# Patient Record
Sex: Male | Born: 1965 | Race: White | Hispanic: No | Smoking: Current every day smoker
Health system: Southern US, Community
[De-identification: ages and names within clinical notes are randomized; demographics above are authoritative.]

## PROBLEM LIST (undated history)

## (undated) DIAGNOSIS — F32A Depression, unspecified: Secondary | ICD-10-CM

## (undated) DIAGNOSIS — T7840XA Allergy, unspecified, initial encounter: Secondary | ICD-10-CM

## (undated) DIAGNOSIS — F419 Anxiety disorder, unspecified: Secondary | ICD-10-CM

## (undated) DIAGNOSIS — J449 Chronic obstructive pulmonary disease, unspecified: Secondary | ICD-10-CM

## (undated) DIAGNOSIS — Z720 Tobacco use: Secondary | ICD-10-CM

## (undated) DIAGNOSIS — I639 Cerebral infarction, unspecified: Secondary | ICD-10-CM

## (undated) DIAGNOSIS — I10 Essential (primary) hypertension: Secondary | ICD-10-CM

## (undated) DIAGNOSIS — E785 Hyperlipidemia, unspecified: Secondary | ICD-10-CM

## (undated) DIAGNOSIS — F1010 Alcohol abuse, uncomplicated: Secondary | ICD-10-CM

## (undated) HISTORY — DX: Chronic obstructive pulmonary disease, unspecified: J44.9

## (undated) HISTORY — DX: Anxiety disorder, unspecified: F41.9

## (undated) HISTORY — PX: HAND TENDON SURGERY: SHX663

## (undated) HISTORY — PX: SKIN GRAFT: SHX250

## (undated) HISTORY — PX: NO PAST SURGERIES: SHX2092

## (undated) HISTORY — DX: Cerebral infarction, unspecified: I63.9

## (undated) HISTORY — DX: Hyperlipidemia, unspecified: E78.5

## (undated) HISTORY — DX: Allergy, unspecified, initial encounter: T78.40XA

## (undated) HISTORY — DX: Depression, unspecified: F32.A

---

## 2006-01-15 ENCOUNTER — Emergency Department: Payer: Self-pay | Admitting: Emergency Medicine

## 2006-01-30 ENCOUNTER — Ambulatory Visit: Payer: Self-pay | Admitting: Unknown Physician Specialty

## 2006-02-02 ENCOUNTER — Encounter: Payer: Self-pay | Admitting: Unknown Physician Specialty

## 2006-02-26 ENCOUNTER — Encounter: Payer: Self-pay | Admitting: Unknown Physician Specialty

## 2006-03-28 ENCOUNTER — Encounter: Payer: Self-pay | Admitting: Unknown Physician Specialty

## 2008-07-27 ENCOUNTER — Emergency Department: Payer: Self-pay | Admitting: Emergency Medicine

## 2008-07-27 ENCOUNTER — Other Ambulatory Visit: Payer: Self-pay

## 2012-05-16 ENCOUNTER — Ambulatory Visit: Payer: Self-pay | Admitting: Internal Medicine

## 2013-02-21 ENCOUNTER — Ambulatory Visit: Payer: Self-pay

## 2017-01-04 ENCOUNTER — Encounter: Payer: Self-pay | Admitting: Medical Oncology

## 2017-01-04 ENCOUNTER — Emergency Department: Payer: Commercial Managed Care - PPO

## 2017-01-04 ENCOUNTER — Emergency Department
Admission: EM | Admit: 2017-01-04 | Discharge: 2017-01-04 | Disposition: A | Discharge status: Home / Self Care | Payer: Commercial Managed Care - PPO | Attending: Emergency Medicine | Admitting: Emergency Medicine

## 2017-01-04 DIAGNOSIS — N281 Cyst of kidney, acquired: Secondary | ICD-10-CM | POA: Diagnosis not present

## 2017-01-04 DIAGNOSIS — R109 Unspecified abdominal pain: Secondary | ICD-10-CM | POA: Diagnosis present

## 2017-01-04 DIAGNOSIS — R0789 Other chest pain: Secondary | ICD-10-CM | POA: Diagnosis not present

## 2017-01-04 LAB — BASIC METABOLIC PANEL
Anion gap: 6 (ref 5–15)
BUN: 11 mg/dL (ref 6–20)
CHLORIDE: 107 mmol/L (ref 101–111)
CO2: 23 mmol/L (ref 22–32)
CREATININE: 0.66 mg/dL (ref 0.61–1.24)
Calcium: 8.7 mg/dL — ABNORMAL LOW (ref 8.9–10.3)
GFR calc non Af Amer: >60 mL/min (ref >60)
Glucose, Bld: 96 mg/dL (ref 65–99)
Potassium: 3.7 mmol/L (ref 3.5–5.1)
SODIUM: 136 mmol/L (ref 135–145)

## 2017-01-04 LAB — URINALYSIS, COMPLETE (UACMP) WITH MICROSCOPIC
BACTERIA UA: NONE SEEN
Bilirubin Urine: NEGATIVE
Glucose, UA: NEGATIVE mg/dL
Ketones, ur: NEGATIVE mg/dL
LEUKOCYTES UA: NEGATIVE
Nitrite: NEGATIVE
PH: 5 (ref 5.0–8.0)
Protein, ur: NEGATIVE mg/dL
SPECIFIC GRAVITY, URINE: 1.02 (ref 1.005–1.030)

## 2017-01-04 LAB — CBC
HEMATOCRIT: 47 % (ref 40.0–52.0)
HEMOGLOBIN: 16.3 g/dL (ref 13.0–18.0)
MCH: 37.3 pg — ABNORMAL HIGH (ref 26.0–34.0)
MCHC: 34.7 g/dL (ref 32.0–36.0)
MCV: 107.3 fL — ABNORMAL HIGH (ref 80.0–100.0)
Platelets: 177 10*3/uL (ref 150–440)
RBC: 4.37 MIL/uL — ABNORMAL LOW (ref 4.40–5.90)
RDW: 14.5 % (ref 11.5–14.5)
WBC: 7.6 10*3/uL (ref 3.8–10.6)

## 2017-01-04 MED ORDER — HYDROMORPHONE HCL 1 MG/ML IJ SOLN
1.0000 mg | Freq: Once | INTRAMUSCULAR | Status: AC
  Administered 2017-01-04: 1 mg via INTRAVENOUS
  Filled 2017-01-04: qty 1

## 2017-01-04 MED ORDER — OXYCODONE-ACETAMINOPHEN 5-325 MG PO TABS
ORAL_TABLET | ORAL | Status: AC
  Filled 2017-01-04: qty 1

## 2017-01-04 MED ORDER — OXYCODONE-ACETAMINOPHEN 5-325 MG PO TABS
1.0000 | ORAL_TABLET | ORAL | Status: DC | PRN
  Administered 2017-01-04: 1 via ORAL

## 2017-01-04 MED ORDER — IBUPROFEN 800 MG PO TABS: 800.0000 mg | ORAL_TABLET | Freq: Three times a day (TID) | ORAL | 0 refills | Status: DC | PRN

## 2017-01-04 MED ORDER — SODIUM CHLORIDE 0.9 % IV BOLUS (SEPSIS)
1000.0000 mL | Freq: Once | INTRAVENOUS | Status: AC
  Administered 2017-01-04: 1000 mL via INTRAVENOUS

## 2017-01-04 MED ORDER — DIAZEPAM 5 MG PO TABS: 5.0000 mg | ORAL_TABLET | Freq: Three times a day (TID) | ORAL | 0 refills | Status: DC | PRN

## 2017-01-04 MED ORDER — OXYCODONE HCL 5 MG PO TABS: 5.0000 mg | ORAL_TABLET | ORAL | 0 refills | Status: DC | PRN

## 2017-01-04 NOTE — ED Triage Notes (Signed)
Pt reports left flank pain that began 2 days ago and has continued to worsen. Denies dysuria. Denies NVD.

## 2017-01-04 NOTE — Discharge Instructions (Signed)
On your CT, both of your kidneys were found to have 1 cyst each. Please let your primary care physician know in order to make recommendations for any additional follow-up that is needed.  Your CT scan did not show any kidney stones or other obvious abnormalities.  Please treat your side pain with ice or heat for 10 minutes every 2-3 hours. You may take Tylenol or Motrin for mild to moderate pain, and oxycodone for severe pain. Valium as for severe muscle spasm. You may.drive or go to work within 8 hours of taking Percocet or Valium. Please make an appointment to follow up with your primary care physician in 3-5 days.  Return to the emergency department if you develop worsening pain, shortness of breath, lightheadedness or fainting, fever, or any other symptoms concerning to you.

## 2017-01-04 NOTE — ED Provider Notes (Signed)
[facility] Emergency Department Provider Note  ____________________________________________  Time seen: Approximately 2:29 PM  I have reviewed the triage vital signs and the nursing notes.   HISTORY  Chief Complaint Flank Pain    HPI Jeff Patton is a 51 y.o. male with ongoing tobacco abuse presenting with left side pain. The patient reports that for the past 3 days, he has had a constant worsening pain in the left lateral chest wall over the lowest ribs, and then radiating towards the back. He works in a [employer], lifting heavy objects, although does not note any particular strain or trauma event. He denies any associated shortness of breath but does have worse pain with breathing or positional changes, or palpation of the area. No overlying skin changes. No associated fever, chills, dysuria, urinary frequency or hematuria. No nausea or vomiting.   History reviewed. No pertinent past medical history.  There are no active problems to display for this patient.   No past surgical history on file.    Allergies Patient has no known allergies.  No family history on file.  Social History Social History  Substance Use Topics  . Smoking status: Not on file  . Smokeless tobacco: Not on file  . Alcohol use Not on file    Review of Systems Constitutional: No fever/chills.No lightheadedness or syncope. Eyes: No visual changes. ENT: No sore throat. No congestion or rhinorrhea. Cardiovascular: Denies chest pain. Denies palpitations. Respiratory: Denies shortness of breath.  No cough. Gastrointestinal: No abdominal pain.  No nausea, no vomiting.  No diarrhea.  No constipation. Positive left lateral lower chest wall pain and left back pain. Genitourinary: Negative for dysuria. No urinary frequency. No hematuria. Musculoskeletal: Left lateral chest wall pain radiating to the back. Skin: Negative for rash. No overlying skin changes. Neurological:  Negative for headaches. No focal numbness, tingling or weakness.   10-point ROS otherwise negative.  ____________________________________________   PHYSICAL EXAM:  VITAL SIGNS: ED Triage Vitals [01/04/17 1112]  Enc Vitals Group     BP (!) 161/110     Pulse Rate 86     Resp 16     Temp 98.2 F (36.8 C)     Temp Source Oral     SpO2 99 %     Weight 180 lb (81.6 kg)     Height 5\' 10"  (1.778 m)     Head Circumference      Peak Flow      Pain Score 10     Pain Loc      Pain Edu?      Excl. in GC?     Constitutional: Alert and oriented. Uncomfortable appearing, in obvious worsening pain with positional changes especially lying in a supine position. Answers questions appropriately. Eyes: Conjunctivae are normal.  EOMI. No scleral icterus. Head: Atraumatic. Nose: No congestion/rhinnorhea. Mouth/Throat: Mucous membranes are moist.  Neck: No stridor.  Supple.  No JVD. No meningismus. Cardiovascular: Normal rate, regular rhythm. No murmurs, rubs or gallops.  Respiratory: Normal respiratory effort.  No accessory muscle use or retractions. Lungs CTAB.  No wheezes, rales or ronchi. Gastrointestinal: Soft, nontender and nondistended.  No guarding or rebound.  No peritoneal signs. Mild left upper quadrant pain, left lateral chest wall pain over the most distal ribs, and minimal left CVA tenderness. No right CVA tenderness. No overlying skin changes or palpable crepitus, no palpable rib instability.  Musculoskeletal: No LE edema.  Neurologic:  A&Ox3.  Speech is clear.  Face and smile  are symmetric.  EOMI.  Moves all extremities well. Skin:  Skin is warm, dry and intact. No rash noted. Psychiatric: Mood and affect are normal. Speech and behavior are normal.  Normal judgement.  ____________________________________________   LABS (all labs ordered are listed, but only abnormal results are displayed)  Labs Reviewed  BASIC METABOLIC PANEL - Abnormal; Notable for the following:        Result Value   Calcium 8.7 (*)    All other components within normal limits  CBC - Abnormal; Notable for the following:    RBC 4.37 (*)    MCV 107.3 (*)    MCH 37.3 (*)    All other components within normal limits  URINALYSIS, COMPLETE (UACMP) WITH MICROSCOPIC   ____________________________________________  EKG  ED ECG REPORT I, Rockne Menghini, the attending physician, personally viewed and interpreted this ECG.   Date: 01/04/2017  EKG Time: 1444  Rate: 54  Rhythm: sinus bradycardia  Axis: normal  Intervals:first degree block  ST&T Change: Nonspecific T-wave inversions in V1. No ST elevation.  ____________________________________________  RADIOLOGY  Ct Renal Stone Study  Result Date: 01/04/2017 CLINICAL DATA:  Two day history of left flank pain EXAM: CT ABDOMEN AND PELVIS WITHOUT CONTRAST TECHNIQUE: Multidetector CT imaging of the abdomen and pelvis was performed following the standard protocol without oral or intravenous contrast material administration. COMPARISON:  None. FINDINGS: Lower chest: There is patchy bibasilar atelectatic change. There is a small hiatal hernia. Hepatobiliary: No focal liver lesions are apparent on this noncontrast enhanced study. Gallbladder wall is not appreciably thickened. There is no biliary duct dilatation. Pancreas: No pancreatic mass or inflammatory focus. Spleen: No splenic lesions are evident. Adrenals/Urinary Tract: Adrenals appear normal bilaterally. There is a 1.7 x 1.7 cm cyst arising from the periphery of the mid right kidney. There is an approximately 1 x 1 cm cyst in mid to lower region left kidney. There is no hydronephrosis on either side. There is no evident renal or ureteral calculus on either side. Urinary bladder is midline with wall thickness within normal limits. Stomach/Bowel: There are multiple sigmoid diverticula without diverticulitis. There is no bowel wall or mesenteric thickening. There is no evident bowel obstruction.  No free air or portal venous air. Vascular/Lymphatic: There is atherosclerotic calcification in the aorta and common iliac arteries. Foci of calcification are noted in each external iliac and internal iliac arteries. No aneurysm. Major mesenteric vessels appear patent on this noncontrast enhanced study. There is no demonstrable adenopathy in the abdomen or pelvis. Reproductive: There are scattered small calculi in the prostate. Prostate and seminal vesicles appear slightly prominent. No pelvic mass or pelvic fluid collection is evident. Other: Appendix appears normal. No ascites or abscess is evident in the abdomen or pelvis. There is a minimal ventral hernia containing only fat. There is mild fat in each inguinal ring. Musculoskeletal: There is lumbar levoscoliosis. There is degenerative change in lumbar spine. There are no blastic or lytic bone lesions. No intramuscular or abdominal wall lesions. IMPRESSION: No renal or ureteral calculi.  No hydronephrosis. Sigmoid diverticula without diverticulitis. No bowel wall thickening or bowel obstruction. No abscess. Appendix appears normal. Aortoiliac atherosclerosis. Small hiatal hernia.  Small ventral hernia containing only fat Scattered small prostatic calculi with prominence of prostate and seminal vesicles. Advise correlation with PSA. Electronically Signed   By: Bretta Bang III M.D.   On: 01/04/2017 15:21    ____________________________________________   PROCEDURES  Procedure(s) performed: None  Procedures  Critical Care performed: No  ____________________________________________   INITIAL IMPRESSION / ASSESSMENT AND PLAN / ED COURSE  Pertinent labs & imaging results that were available during my care of the patient were reviewed by me and considered in my medical decision making (see chart for details).  51 y.o. male with ongoing tobacco abuse presenting with 3 days of progressively worsening left-sided pain that is worse with positional  changes. Given the amount of discomfort the patient has with positional changes, deep breaths, and palpation, it is possible that he has a musculoskeletal strain or a nerve impingement. However, renal colic, subclinical rib fracture, are also possible. Obstruction is very unlikely. We'll get CT imaging of the abdomen and pelvis, which should capture the lower thorax as well. In the meantime, I'll treat the patient symptomatically and reevaluate him for his pain.  ----------------------------------------- 4:05 PM on 01/04/2017 -----------------------------------------  The patient's laboratory studies are reassuring.  He continues to be hemodynamically stable and his pain has significantly improved with symptomatic treatment. The CT scan shows bilateral renal cysts, which are not the cause of his pain, but I have told him to let his primary care physician now for further monitoring recommendations. There are no kidney stones, or any other acute causes for the patient's pain. I will discharge the patient home with instructions for cryotherapy, heat therapy, and symptomatic relief or muscular skeletal pain. The patient understands return precautions as well as follow-up instructions.   ____________________________________________  FINAL CLINICAL IMPRESSION(S) / ED DIAGNOSES  Final diagnoses:  Left flank pain  Bilateral renal cysts  Left-sided chest wall pain         NEW MEDICATIONS STARTED DURING THIS VISIT:  New Prescriptions   DIAZEPAM (VALIUM) 5 MG TABLET    Take 1 tablet (5 mg total) by mouth every 8 (eight) hours as needed for muscle spasms.   IBUPROFEN (ADVIL,MOTRIN) 800 MG TABLET    Take 1 tablet (800 mg total) by mouth every 8 (eight) hours as needed for mild pain or moderate pain (with food).   OXYCODONE (ROXICODONE) 5 MG IMMEDIATE RELEASE TABLET    Take 1-2 tablets (5-10 mg total) by mouth every 4 (four) hours as needed for severe pain.      Rockne Menghini, MD 01/04/17  [phone]

## 2017-05-21 ENCOUNTER — Emergency Department: Payer: Commercial Managed Care - PPO

## 2017-05-21 ENCOUNTER — Inpatient Hospital Stay
Admission: EM | Admit: 2017-05-21 | Discharge: 2017-05-26 | DRG: 871 | Disposition: A | Discharge status: Home / Self Care | Payer: Commercial Managed Care - PPO | Attending: Internal Medicine | Admitting: Internal Medicine

## 2017-05-21 ENCOUNTER — Encounter: Payer: Self-pay | Admitting: Emergency Medicine

## 2017-05-21 DIAGNOSIS — I10 Essential (primary) hypertension: Secondary | ICD-10-CM | POA: Diagnosis present

## 2017-05-21 DIAGNOSIS — J440 Chronic obstructive pulmonary disease with acute lower respiratory infection: Secondary | ICD-10-CM | POA: Diagnosis present

## 2017-05-21 DIAGNOSIS — J181 Lobar pneumonia, unspecified organism: Secondary | ICD-10-CM

## 2017-05-21 DIAGNOSIS — R042 Hemoptysis: Secondary | ICD-10-CM | POA: Diagnosis present

## 2017-05-21 DIAGNOSIS — R1011 Right upper quadrant pain: Secondary | ICD-10-CM

## 2017-05-21 DIAGNOSIS — F17210 Nicotine dependence, cigarettes, uncomplicated: Secondary | ICD-10-CM | POA: Diagnosis present

## 2017-05-21 DIAGNOSIS — I82412 Acute embolism and thrombosis of left femoral vein: Secondary | ICD-10-CM | POA: Diagnosis present

## 2017-05-21 DIAGNOSIS — I2699 Other pulmonary embolism without acute cor pulmonale: Secondary | ICD-10-CM | POA: Diagnosis present

## 2017-05-21 DIAGNOSIS — J189 Pneumonia, unspecified organism: Secondary | ICD-10-CM

## 2017-05-21 DIAGNOSIS — Z8249 Family history of ischemic heart disease and other diseases of the circulatory system: Secondary | ICD-10-CM

## 2017-05-21 DIAGNOSIS — R0602 Shortness of breath: Secondary | ICD-10-CM

## 2017-05-21 DIAGNOSIS — A419 Sepsis, unspecified organism: Principal | ICD-10-CM

## 2017-05-21 DIAGNOSIS — R0781 Pleurodynia: Secondary | ICD-10-CM

## 2017-05-21 DIAGNOSIS — R109 Unspecified abdominal pain: Secondary | ICD-10-CM

## 2017-05-21 HISTORY — DX: Essential (primary) hypertension: I10

## 2017-05-21 LAB — HEPATIC FUNCTION PANEL
ALBUMIN: 3.5 g/dL (ref 3.5–5.0)
ALT: 15 U/L — ABNORMAL LOW (ref 17–63)
AST: 31 U/L (ref 15–41)
Alkaline Phosphatase: 77 U/L (ref 38–126)
BILIRUBIN TOTAL: 1.9 mg/dL — AB (ref 0.3–1.2)
Bilirubin, Direct: 0.4 mg/dL (ref 0.1–0.5)
Indirect Bilirubin: 1.5 mg/dL — ABNORMAL HIGH (ref 0.3–0.9)
TOTAL PROTEIN: 7 g/dL (ref 6.5–8.1)

## 2017-05-21 LAB — URINALYSIS, COMPLETE (UACMP) WITH MICROSCOPIC
BILIRUBIN URINE: NEGATIVE
Bacteria, UA: NONE SEEN
Glucose, UA: NEGATIVE mg/dL
Ketones, ur: NEGATIVE mg/dL
Leukocytes, UA: NEGATIVE
NITRITE: NEGATIVE
Protein, ur: NEGATIVE mg/dL
SPECIFIC GRAVITY, URINE: 1.014 (ref 1.005–1.030)
Squamous Epithelial / LPF: NONE SEEN
pH: 7 (ref 5.0–8.0)

## 2017-05-21 LAB — BASIC METABOLIC PANEL
Anion gap: 9 (ref 5–15)
BUN: 8 mg/dL (ref 6–20)
CALCIUM: 8.8 mg/dL — AB (ref 8.9–10.3)
CO2: 24 mmol/L (ref 22–32)
CREATININE: 0.66 mg/dL (ref 0.61–1.24)
Chloride: 100 mmol/L — ABNORMAL LOW (ref 101–111)
GFR calc Af Amer: >60 mL/min (ref >60)
Glucose, Bld: 97 mg/dL (ref 65–99)
Potassium: 3.9 mmol/L (ref 3.5–5.1)
SODIUM: 133 mmol/L — AB (ref 135–145)

## 2017-05-21 LAB — LACTIC ACID, PLASMA: Lactic Acid, Venous: 1 mmol/L (ref 0.5–1.9)

## 2017-05-21 LAB — CBC
HCT: 47.7 % (ref 40.0–52.0)
Hemoglobin: 16.6 g/dL (ref 13.0–18.0)
MCH: 37 pg — ABNORMAL HIGH (ref 26.0–34.0)
MCHC: 34.8 g/dL (ref 32.0–36.0)
MCV: 106.5 fL — ABNORMAL HIGH (ref 80.0–100.0)
PLATELETS: 147 10*3/uL — AB (ref 150–440)
RBC: 4.48 MIL/uL (ref 4.40–5.90)
RDW: 14.1 % (ref 11.5–14.5)
WBC: 12.8 10*3/uL — AB (ref 3.8–10.6)

## 2017-05-21 LAB — TROPONIN I: Troponin I: <0.03 ng/mL (ref <0.03)

## 2017-05-21 MED ORDER — ACETAMINOPHEN 325 MG PO TABS
650.0000 mg | ORAL_TABLET | Freq: Four times a day (QID) | ORAL | Status: DC | PRN
  Filled 2017-05-21: qty 2

## 2017-05-21 MED ORDER — IPRATROPIUM-ALBUTEROL 0.5-2.5 (3) MG/3ML IN SOLN
3.0000 mL | RESPIRATORY_TRACT | Status: DC | PRN
  Filled 2017-05-21: qty 3

## 2017-05-21 MED ORDER — ENOXAPARIN SODIUM 40 MG/0.4ML ~~LOC~~ SOLN: 40.0000 mg | SUBCUTANEOUS | Status: DC

## 2017-05-21 MED ORDER — CYCLOBENZAPRINE HCL 10 MG PO TABS
5.0000 mg | ORAL_TABLET | Freq: Three times a day (TID) | ORAL | Status: DC | PRN
  Administered 2017-05-21 – 2017-05-25 (×5): 5 mg via ORAL
  Filled 2017-05-21 (×2): qty 1
  Filled 2017-05-21: qty 0.5
  Filled 2017-05-21 (×3): qty 1

## 2017-05-21 MED ORDER — SODIUM CHLORIDE 0.9 % IV BOLUS (SEPSIS)
500.0000 mL | Freq: Once | INTRAVENOUS | Status: AC
  Administered 2017-05-21: 500 mL via INTRAVENOUS

## 2017-05-21 MED ORDER — ONDANSETRON HCL 4 MG/2ML IJ SOLN
4.0000 mg | Freq: Four times a day (QID) | INTRAMUSCULAR | Status: DC | PRN
  Filled 2017-05-21: qty 2

## 2017-05-21 MED ORDER — CEFTRIAXONE SODIUM-DEXTROSE 1-3.74 GM-% IV SOLR
1.0000 g | INTRAVENOUS | Status: DC
  Filled 2017-05-21: qty 50

## 2017-05-21 MED ORDER — ONDANSETRON HCL 4 MG/2ML IJ SOLN
4.0000 mg | Freq: Once | INTRAMUSCULAR | Status: AC
  Administered 2017-05-21: 4 mg via INTRAVENOUS
  Filled 2017-05-21: qty 2

## 2017-05-21 MED ORDER — GUAIFENESIN-DM 100-10 MG/5ML PO SYRP
5.0000 mL | ORAL_SOLUTION | ORAL | Status: DC | PRN
  Administered 2017-05-24 (×2): 5 mL via ORAL
  Filled 2017-05-21 (×4): qty 5

## 2017-05-21 MED ORDER — IPRATROPIUM-ALBUTEROL 0.5-2.5 (3) MG/3ML IN SOLN
3.0000 mL | Freq: Once | RESPIRATORY_TRACT | Status: AC
  Administered 2017-05-21: 3 mL via RESPIRATORY_TRACT
  Filled 2017-05-21: qty 3

## 2017-05-21 MED ORDER — ENOXAPARIN SODIUM 40 MG/0.4ML ~~LOC~~ SOLN
40.0000 mg | SUBCUTANEOUS | Status: DC
  Administered 2017-05-21 – 2017-05-22 (×2): 40 mg via SUBCUTANEOUS
  Filled 2017-05-21 (×2): qty 0.4

## 2017-05-21 MED ORDER — DEXTROSE 5 % IV SOLN
1.0000 g | Freq: Once | INTRAVENOUS | Status: AC
  Administered 2017-05-21: 1 g via INTRAVENOUS
  Filled 2017-05-21: qty 10

## 2017-05-21 MED ORDER — MORPHINE SULFATE (PF) 4 MG/ML IV SOLN
4.0000 mg | INTRAVENOUS | Status: DC | PRN
  Administered 2017-05-23 – 2017-05-24 (×8): 4 mg via INTRAVENOUS
  Filled 2017-05-21 (×8): qty 1

## 2017-05-21 MED ORDER — OXYCODONE HCL 5 MG PO TABS
5.0000 mg | ORAL_TABLET | ORAL | Status: DC | PRN
  Administered 2017-05-21 – 2017-05-25 (×6): 5 mg via ORAL
  Filled 2017-05-21 (×7): qty 1

## 2017-05-21 MED ORDER — FENTANYL CITRATE (PF) 100 MCG/2ML IJ SOLN
100.0000 ug | INTRAMUSCULAR | Status: DC | PRN
  Administered 2017-05-21 (×2): 100 ug via INTRAVENOUS
  Filled 2017-05-21 (×2): qty 2

## 2017-05-21 MED ORDER — DEXTROSE 5 % IV SOLN
500.0000 mg | INTRAVENOUS | Status: DC
  Administered 2017-05-22: 500 mg via INTRAVENOUS
  Filled 2017-05-21: qty 500

## 2017-05-21 MED ORDER — ONDANSETRON HCL 4 MG PO TABS
4.0000 mg | ORAL_TABLET | Freq: Four times a day (QID) | ORAL | Status: DC | PRN
  Filled 2017-05-21: qty 1

## 2017-05-21 MED ORDER — MORPHINE SULFATE (PF) 4 MG/ML IV SOLN
4.0000 mg | INTRAVENOUS | Status: DC | PRN
  Administered 2017-05-21: 4 mg via INTRAVENOUS
  Filled 2017-05-21: qty 1

## 2017-05-21 MED ORDER — SODIUM CHLORIDE 0.9 % IV BOLUS (SEPSIS)
1000.0000 mL | Freq: Once | INTRAVENOUS | Status: AC
  Administered 2017-05-21: 1000 mL via INTRAVENOUS

## 2017-05-21 MED ORDER — NICOTINE 14 MG/24HR TD PT24
14.0000 mg | MEDICATED_PATCH | Freq: Every day | TRANSDERMAL | Status: DC
  Administered 2017-05-21 – 2017-05-26 (×7): 14 mg via TRANSDERMAL
  Filled 2017-05-21 (×6): qty 1

## 2017-05-21 MED ORDER — LORAZEPAM 2 MG/ML IJ SOLN
2.0000 mg | Freq: Once | INTRAMUSCULAR | Status: AC
Start: 2017-05-21 — End: 2017-05-21
  Administered 2017-05-21: 2 mg via INTRAVENOUS
  Filled 2017-05-21: qty 1

## 2017-05-21 MED ORDER — AZITHROMYCIN 500 MG IV SOLR
500.0000 mg | Freq: Once | INTRAVENOUS | Status: AC
  Administered 2017-05-21: 500 mg via INTRAVENOUS
  Filled 2017-05-21: qty 500

## 2017-05-21 MED ORDER — ACETAMINOPHEN 650 MG RE SUPP
650.0000 mg | Freq: Four times a day (QID) | RECTAL | Status: DC | PRN
  Filled 2017-05-21: qty 1

## 2017-05-21 MED ORDER — KETOROLAC TROMETHAMINE 30 MG/ML IJ SOLN
15.0000 mg | Freq: Once | INTRAMUSCULAR | Status: AC
  Administered 2017-05-23: 12:00:00 15 mg via INTRAVENOUS
  Filled 2017-05-21: qty 1

## 2017-05-21 NOTE — ED Triage Notes (Signed)
Pt reports right side chest wall pain that was radiating to the front earlier this morning. Denies any injury. Pt arrived via EMS from home.   Pt holding on the right side taking shallow breaths.  Pt tachypneic.  Pt states he has a chronic cough. Denies any injury. Pain radiates to the back as well.

## 2017-05-21 NOTE — ED Notes (Signed)
Pt returned from xray via stretcher.

## 2017-05-21 NOTE — Consult Note (Signed)
Pharmacy Antibiotic Note  Jeff Patton is a 51 y.o. male admitted on 05/21/2017 with pneumonia.  Pharmacy has been consulted for azithromycin and ceftriaxone dosing.  Plan: azithromycin 500mg  q 24hr, ceftriaxone 1g q 24 hr  Pharmacy will sign off  Height: 5\' 10"  (177.8 cm) Weight: 185 lb (83.9 kg) IBW/kg (Calculated) : 73  Temp (24hrs), Avg:100.5 F (38.1 C), Min:99 F (37.2 C), Max:101.9 F (38.8 C)   Recent Labs Lab 05/21/17 1138  WBC 12.8*  CREATININE 0.66    Estimated Creatinine Clearance: 115.3 mL/min (by C-G formula based on SCr of 0.66 mg/dL).    No Known Allergies  Antimicrobials this admission: ceftriaxone 6/24 >>  azithromycin 6/24 >>   Dose adjustments this admission:   Microbiology results: 6/24 BCx:    Thank you for allowing pharmacy to be a part of this patient's care.  Jeff Patton, Pharm.D, BCPS Clinical Pharmacist  05/21/2017 4:28 PM

## 2017-05-21 NOTE — ED Notes (Signed)
Ems pt vis wheelchair ,back/flank pain , cough , 7/10 pain , 20 gauge r hand , HR 100 , 99%RA, NKA , No meds

## 2017-05-21 NOTE — ED Notes (Signed)
Pt states he started with back pain last night on R side and now it has radiated to R rib cage. Pt denies N&V&D&fever. States he felt chills and sweats through out night. Alert, oriented, guarding R rib cage. States kidney infection at age 89, denies kidney stones. Medical hx HTN.

## 2017-05-21 NOTE — ED Notes (Signed)
Pt taken to CT via stretcher.

## 2017-05-21 NOTE — ED Provider Notes (Signed)
[facility] Emergency Department Provider Note    None    (approximate)  I have reviewed the triage vital signs and the nursing notes.   HISTORY  Chief Complaint Right flank pain   HPI Jeff Patton is a 51 y.o. male starts with right upper quadrant and right upper back pain that awoke the patient sleep early this morning. Since he started on some discomfort yesterday. Patient currently rates the pain as 10 out of 10 in severity. No fevers. States it is hard for him to catch his breath due to the pain. Denies any lower extremity swelling. Does smoke. No recent cough. No pain when he takes a deep breath. There is no midsternal pain or discomfort radiating to his jaw left shoulder. There is no diaphoresis.. Denies any dysuria or hematuria.   Past Medical History:  Diagnosis Date  . Hypertension    History reviewed. No pertinent family history. History reviewed. No pertinent surgical history. There are no active problems to display for this patient.     Prior to Admission medications   Medication Sig Start Date End Date Taking? Authorizing Provider  diazepam (VALIUM) 5 MG tablet Take 1 tablet (5 mg total) by mouth every 8 (eight) hours as needed for muscle spasms. 01/04/17 01/04/18  Rockne Menghini, MD  ibuprofen (ADVIL,MOTRIN) 800 MG tablet Take 1 tablet (800 mg total) by mouth every 8 (eight) hours as needed for mild pain or moderate pain (with food). 01/04/17   Rockne Menghini, MD  oxyCODONE (ROXICODONE) 5 MG immediate release tablet Take 1-2 tablets (5-10 mg total) by mouth every 4 (four) hours as needed for severe pain. 01/04/17   Rockne Menghini, MD    Allergies Patient has no known allergies.    Social History Social History  Substance Use Topics  . Smoking status: Current Every Day Smoker    Packs/day: 1.00    Types: Cigarettes  . Smokeless tobacco: Never Used  . Alcohol use No    Review of Systems Patient denies  headaches, rhinorrhea, blurry vision, numbness, shortness of breath, chest pain, edema, cough, abdominal pain, nausea, vomiting, diarrhea, dysuria, fevers, rashes or hallucinations unless otherwise stated above in HPI. ____________________________________________   PHYSICAL EXAM:  VITAL SIGNS: Vitals:   05/21/17 1109  BP: (!) 155/101  Pulse: 99  Resp: (!) 22  Temp: 99 F (37.2 C)    Constitutional: Alert and oriented. Uncomfortable appearing but in no acute distress. Eyes: Conjunctivae are normal.  Head: Atraumatic. Nose: No congestion/rhinnorhea. Mouth/Throat: Mucous membranes are moist.   Neck: No stridor. Painless ROM.  Cardiovascular: Normal rate, regular rhythm. Grossly normal heart sounds.  Good peripheral circulation. Respiratory: Normal respiratory effort.  No retractions. Lungs CTAB. Gastrointestinal: Soft, with RUQ ttp. No distention. No abdominal bruits. + right cva ttp Musculoskeletal: No lower extremity tenderness nor edema.  No joint effusions. Neurologic:  Normal speech and language. No gross focal neurologic deficits are appreciated. No facial droop Skin:  Skin is warm, dry and intact. No rash noted. Psychiatric: Mood and affect are normal. Speech and behavior are normal.  ____________________________________________   LABS (all labs ordered are listed, but only abnormal results are displayed)  Results for orders placed or performed during the hospital encounter of 05/21/17 (from the past 24 hour(s))  Basic metabolic panel     Status: Abnormal   Collection Time: 05/21/17 11:38 AM  Result Value Ref Range   Sodium 133 (L) 135 - 145 mmol/L   Potassium 3.9 3.5 -  5.1 mmol/L   Chloride 100 (L) 101 - 111 mmol/L   CO2 24 22 - 32 mmol/L   Glucose, Bld 97 65 - 99 mg/dL   BUN 8 6 - 20 mg/dL   Creatinine, Ser 1.61 0.61 - 1.24 mg/dL   Calcium 8.8 (L) 8.9 - 10.3 mg/dL   GFR calc non Af Amer >60 >60 mL/min   GFR calc Af Amer >60 >60 mL/min   Anion gap 9 5 - 15  CBC      Status: Abnormal   Collection Time: 05/21/17 11:38 AM  Result Value Ref Range   WBC 12.8 (H) 3.8 - 10.6 K/uL   RBC 4.48 4.40 - 5.90 MIL/uL   Hemoglobin 16.6 13.0 - 18.0 g/dL   HCT 09.6 04.5 - 40.9 %   MCV 106.5 (H) 80.0 - 100.0 fL   MCH 37.0 (H) 26.0 - 34.0 pg   MCHC 34.8 32.0 - 36.0 g/dL   RDW 81.1 91.4 - 78.2 %   Platelets 147 (L) 150 - 440 K/uL  Troponin I     Status: None   Collection Time: 05/21/17 11:38 AM  Result Value Ref Range   Troponin I <0.03 <0.03 ng/mL  Hepatic function panel     Status: Abnormal   Collection Time: 05/21/17 11:38 AM  Result Value Ref Range   Total Protein 7.0 6.5 - 8.1 g/dL   Albumin 3.5 3.5 - 5.0 g/dL   AST 31 15 - 41 U/L   ALT 15 (L) 17 - 63 U/L   Alkaline Phosphatase 77 38 - 126 U/L   Total Bilirubin 1.9 (H) 0.3 - 1.2 mg/dL   Bilirubin, Direct 0.4 0.1 - 0.5 mg/dL   Indirect Bilirubin 1.5 (H) 0.3 - 0.9 mg/dL   ____________________________________________  EKG My review and personal interpretation at Time: 11:17   Indication: chest pain  Rate: 95  Rhythm: sinus Axis: normal Other: normal intervals, no stemi ____________________________________________  RADIOLOGY  I personally reviewed all radiographic images ordered to evaluate for the above acute complaints and reviewed radiology reports and findings.  These findings were personally discussed with the patient.  Please see medical record for radiology report.  ____________________________________________   PROCEDURES  Procedure(s) performed:  Procedures    Critical Care performed: yes CRITICAL CARE Performed by: Willy Eddy   Total critical care time: 30 minutes  Critical care time was exclusive of separately billable procedures and treating other patients.  Critical care was necessary to treat or prevent imminent or life-threatening deterioration.  Critical care was time spent personally by me on the following activities: development of treatment plan with patient  and/or surrogate as well as nursing, discussions with consultants, evaluation of patient's response to treatment, examination of patient, obtaining history from patient or surrogate, ordering and performing treatments and interventions, ordering and review of laboratory studies, ordering and review of radiographic studies, pulse oximetry and re-evaluation of patient's condition.  ____________________________________________   INITIAL IMPRESSION / ASSESSMENT AND PLAN / ED COURSE  Pertinent labs & imaging results that were available during my care of the patient were reviewed by me and considered in my medical decision making (see chart for details).  DDX: ACS, pericarditis, esophagitis, boerhaaves, pe, dissection, pna, bronchitis, costochondritis   Jeff Patton is a 51 y.o. who presents to the ED with above complaints of chest pain as described above.  Presentation concerning for kidney stone based on his significant discomfort. He is to Light here breath sounds bilaterally and suspicious that this is  splinting from pain. Does have some abdominal tenderness. Will order CT imaging to evaluate for any evidence of stone.  The patient will be placed on continuous pulse oximetry and telemetry for monitoring.  Laboratory evaluation will be sent to evaluate for the above complaints.      Clinical Course as of May 21 1552  Sun May 21, 2017  1530 Patient now febrile. He is sent for right upper quadrant ultrasound to ensure this is not a referred pain from cholecystitis. Certain possible pneumonia.  [PR]  1552 Patient reassessed. Still persistent tachypnea. This provided for his symptoms are secondary to likely pleurisy in the setting of a community acquired pneumonia. He does meet septic criteria and therefore do feel patient will require admission to hospital for further evaluation and management.  [PR]    Clinical Course User Index [PR] Willy Eddyobinson, Jeshurun [place], MD      ____________________________________________   FINAL CLINICAL IMPRESSION(S) / ED DIAGNOSES  Final diagnoses:  RUQ pain  Community acquired pneumonia of right lower lobe of lung (HCC)  Sepsis, due to unspecified organism [facility](HCC)      NEW MEDICATIONS STARTED DURING THIS VISIT:  New Prescriptions   No medications on file     Note:  This document was prepared using Dragon voice recognition software and may include unintentional dictation errors.    Willy Eddyobinson, Willa Brocks, MD 05/21/17 [phone]

## 2017-05-21 NOTE — H&P (Signed)
[facility] - [place] at [facility]   PATIENT NAME: Jeff Patton    MR#:  [id number]  DATE OF BIRTH:  1966-02-22  DATE OF ADMISSION:  05/21/2017  PRIMARY CARE PHYSICIAN: Patient, No Pcp Per   REQUESTING/REFERRING PHYSICIAN: Roxan Hockeyobinson, MD  CHIEF COMPLAINT:   Chief Complaint  Patient presents with  . Chest Pain    HISTORY OF PRESENT ILLNESS:  Jeff Patton  is a 51 y.o. male who presents with Right-sided back and chest pain. He states his symptoms started around 2:00 this morning. Later on he began to develop some shortness of breath.  Pain is worse with deep breathing. Here in the ED he became febrile, was found to have a mildly elevated white blood cell count, and is mildly tachycardic. Imaging initially ordered for evaluation of possible renal stone showed right basilar pneumonia. Hospitalists were called for admission.  PAST MEDICAL HISTORY:   Past Medical History:  Diagnosis Date  . Hypertension     PAST SURGICAL HISTORY:   Past Surgical History:  Procedure Laterality Date  . NO PAST SURGERIES      SOCIAL HISTORY:   Social History  Substance Use Topics  . Smoking status: Current Every Day Smoker    Packs/day: 1.00    Types: Cigarettes  . Smokeless tobacco: Never Used  . Alcohol use No    FAMILY HISTORY:   Family History  Problem Relation Age of Onset  . Hypertension Other     DRUG ALLERGIES:  No Known Allergies  MEDICATIONS AT HOME:   Prior to Admission medications   Medication Sig Start Date End Date Taking? Authorizing Provider  diazepam (VALIUM) 5 MG tablet Take 1 tablet (5 mg total) by mouth every 8 (eight) hours as needed for muscle spasms. 01/04/17 01/04/18  Rockne MenghiniNorman, Anne-Caroline, MD  ibuprofen (ADVIL,MOTRIN) 800 MG tablet Take 1 tablet (800 mg total) by mouth every 8 (eight) hours as needed for mild pain or moderate pain (with food). 01/04/17   Rockne MenghiniNorman, Anne-Caroline, MD  oxyCODONE (ROXICODONE) 5 MG immediate release tablet  Take 1-2 tablets (5-10 mg total) by mouth every 4 (four) hours as needed for severe pain. 01/04/17   Rockne MenghiniNorman, Anne-Caroline, MD    REVIEW OF SYSTEMS:  Review of Systems  Constitutional: Negative for chills, fever, malaise/fatigue and weight loss.  HENT: Negative for ear pain, hearing loss and tinnitus.   Eyes: Negative for blurred vision, double vision, pain and redness.  Respiratory: Positive for shortness of breath. Negative for cough and hemoptysis.   Cardiovascular: Positive for chest pain. Negative for palpitations, orthopnea and leg swelling.  Gastrointestinal: Negative for abdominal pain, constipation, diarrhea, nausea and vomiting.  Genitourinary: Negative for dysuria, frequency and hematuria.  Musculoskeletal: Positive for back pain. Negative for joint pain and neck pain.  Skin:       No acne, rash, or lesions  Neurological: Negative for dizziness, tremors, focal weakness and weakness.  Endo/Heme/Allergies: Negative for polydipsia. Does not bruise/bleed easily.  Psychiatric/Behavioral: Negative for depression. The patient is not nervous/anxious and does not have insomnia.      VITAL SIGNS:   Vitals:   05/21/17 1109 05/21/17 1110 05/21/17 1323  BP: (!) 155/101    Pulse: 99    Resp: (!) 22    Temp: 99 F (37.2 C)  (!) 101.9 F (38.8 C)  TempSrc: Oral  Rectal  SpO2: 98%    Weight:  83.9 kg (185 lb)   Height:  5\' 10"  (1.778 m)    Wt  Readings from Last 3 Encounters:  05/21/17 83.9 kg (185 lb)  01/04/17 81.6 kg (180 lb)    PHYSICAL EXAMINATION:  Physical Exam  Vitals reviewed. Constitutional: He is oriented to person, place, and time. He appears well-developed and well-nourished. No distress.  HENT:  Head: Normocephalic and atraumatic.  Mouth/Throat: Oropharynx is clear and moist.  Eyes: Conjunctivae and EOM are normal. Pupils are equal, round, and reactive to light. No scleral icterus.  Neck: Normal range of motion. Neck supple. No JVD present. No thyromegaly present.   Cardiovascular: Normal rate, regular rhythm and intact distal pulses.  Exam reveals no gallop and no friction rub.   No murmur heard. Respiratory: Effort normal. No respiratory distress. He has no wheezes. He has rales (right base).  GI: Soft. Bowel sounds are normal. He exhibits no distension. There is no tenderness.  Musculoskeletal: Normal range of motion. He exhibits no edema.  No arthritis, no gout  Lymphadenopathy:    He has no cervical adenopathy.  Neurological: He is alert and oriented to person, place, and time. No cranial nerve deficit.  No dysarthria, no aphasia  Skin: Skin is warm and dry. No rash noted. No erythema.  Psychiatric: He has a normal mood and affect. His behavior is normal. Judgment and thought content normal.    LABORATORY PANEL:   CBC  Recent Labs Lab 05/21/17 1138  WBC 12.8*  HGB 16.6  HCT 47.7  PLT 147*   ------------------------------------------------------------------------------------------------------------------  Chemistries   Recent Labs Lab 05/21/17 1138  NA 133*  K 3.9  CL 100*  CO2 24  GLUCOSE 97  BUN 8  CREATININE 0.66  CALCIUM 8.8*  AST 31  ALT 15*  ALKPHOS 77  BILITOT 1.9*   ------------------------------------------------------------------------------------------------------------------  Cardiac Enzymes  Recent Labs Lab 05/21/17 1138  TROPONINI <0.03   ------------------------------------------------------------------------------------------------------------------  RADIOLOGY:  Dg Chest 2 View  Result Date: 05/21/2017 CLINICAL DATA:  Smoker. Hx of HTN. Pt reports right side chest wall pain that was radiating to the front earlier this morning. Denies any injury. Pt arrived via EMS from home. Pt holding on the right side taking shallow breaths. Pt tachypneic.*comment was truncated* EXAM: CHEST  2 VIEW COMPARISON:  None. FINDINGS: Normal cardiac silhouette. There is linear passes is LEFT RIGHT lung base. No pleural  fluid. No pulmonary edema the upper lobes. No pneumothorax. No osseous abnormality IMPRESSION: Bibasilar linear opacities with differential including atelectasis, infiltrate or interstitial edema. This Electronically Signed   By: Genevive Bi M.D.   On: 05/21/2017 11:50   Ct Renal Stone Study  Result Date: 05/21/2017 CLINICAL DATA:  Right flank pain. EXAM: CT ABDOMEN AND PELVIS WITHOUT CONTRAST TECHNIQUE: Multidetector CT imaging of the abdomen and pelvis was performed following the standard protocol without IV contrast. COMPARISON:  01/04/2017 FINDINGS: Lower chest: There are small patchy areas of infiltrate at the right lung base posteriorly. Tiny right effusion. Small hiatal hernia. Hepatobiliary: No focal liver abnormality is seen. No gallstones, gallbladder wall thickening, or biliary dilatation. Pancreas: Unremarkable. No pancreatic ductal dilatation or surrounding inflammatory changes. Spleen: Normal in size without focal abnormality. Adrenals/Urinary Tract: Adrenal glands are normal. 17 mm exophytic cyst on the mid right kidney, unchanged. Left kidney is normal. No hydronephrosis. Bladder appears normal. Stomach/Bowel: Small hiatal hernia. Scattered diverticula in the distal colon. No diverticulitis. Small bowel is normal. Appendix is normal. Vascular/Lymphatic: Aortic atherosclerosis. No enlarged abdominal or pelvic lymph nodes. Reproductive: Prostate is unremarkable. Other: No abdominal wall hernia or abnormality. No abdominopelvic ascites. Musculoskeletal: No  acute or significant osseous findings. IMPRESSION: 1. Small patchy areas of infiltrate posteriorly at the right lung base with a tiny right effusion. Is the patient febrile? 2. Scattered diverticula in the distal colon without evidence of diverticulitis. 3. No renal or ureteral or bladder calculi. Electronically Signed   By: Francene Boyers M.D.   On: 05/21/2017 12:16   US Abdomen Limited Ruq  Result Date: 05/21/2017 CLINICAL DATA:  Right  upper quadrant pain since this morning. EXAM: ULTRASOUND ABDOMEN LIMITED RIGHT UPPER QUADRANT COMPARISON:  CT 05/21/2017 and 01/04/2017 FINDINGS: Patient could not lay supine as imaging performed in the semi-erect position. Gallbladder: No gallstones or wall thickening visualized. No sonographic Murphy sign noted by sonographer. Common bile duct: Diameter: 2.0 mm. Liver: No focal lesion identified. Within normal limits in parenchymal echogenicity. IMPRESSION: No acute findings. Electronically Signed   By: Elberta Fortis M.D.   On: 05/21/2017 15:35    EKG:   Orders placed or performed during the hospital encounter of 05/21/17  . ED EKG within 10 minutes  . ED EKG within 10 minutes    IMPRESSION AND PLAN:  Principal Problem:   Sepsis (HCC) - IV antibiotics given in the ED, lactate is pending, blood pressure stable, cultures sent from the ED. Active Problems:   CAP (community acquired pneumonia) - IV antibiotics and cultures as above, when necessary duo nebs and antitussive   HTN (hypertension) - continue home meds  All the records are reviewed and case discussed with ED provider. Management plans discussed with the patient and/or family.  DVT PROPHYLAXIS: SubQ lovenox  GI PROPHYLAXIS: None  ADMISSION STATUS: Inpatient  CODE STATUS: Full Code Status History    This patient does not have a recorded code status. Please follow your organizational policy for patients in this situation.      TOTAL TIME TAKING CARE OF THIS PATIENT: 45 minutes.   Terez Montee FIELDING 05/21/2017, 4:20 PM  [facility]  Office  [phone]  CC: Primary care physician; Patient, No Pcp Per  Note:  This document was prepared using Dragon voice recognition software and may include unintentional dictation errors.

## 2017-05-22 LAB — CBC
HCT: 41.2 % (ref 40.0–52.0)
Hemoglobin: 14.6 g/dL (ref 13.0–18.0)
MCH: 38.2 pg — AB (ref 26.0–34.0)
MCHC: 35.4 g/dL (ref 32.0–36.0)
MCV: 107.9 fL — AB (ref 80.0–100.0)
PLATELETS: 116 10*3/uL — AB (ref 150–440)
RBC: 3.82 MIL/uL — ABNORMAL LOW (ref 4.40–5.90)
RDW: 14.3 % (ref 11.5–14.5)
WBC: 9.2 10*3/uL (ref 3.8–10.6)

## 2017-05-22 LAB — BASIC METABOLIC PANEL
Anion gap: 4 — ABNORMAL LOW (ref 5–15)
BUN: 10 mg/dL (ref 6–20)
CHLORIDE: 99 mmol/L — AB (ref 101–111)
CO2: 28 mmol/L (ref 22–32)
CREATININE: 0.92 mg/dL (ref 0.61–1.24)
Calcium: 8 mg/dL — ABNORMAL LOW (ref 8.9–10.3)
GFR calc Af Amer: >60 mL/min (ref >60)
GFR calc non Af Amer: >60 mL/min (ref >60)
GLUCOSE: 98 mg/dL (ref 65–99)
Potassium: 3.6 mmol/L (ref 3.5–5.1)
SODIUM: 131 mmol/L — AB (ref 135–145)

## 2017-05-22 MED ORDER — DEXTROSE 5 % IV SOLN
1.0000 g | INTRAVENOUS | Status: AC
  Administered 2017-05-22 – 2017-05-25 (×4): 1 g via INTRAVENOUS
  Filled 2017-05-22 (×4): qty 10

## 2017-05-22 NOTE — Care Management (Signed)
Admitted to [facility] with the diagnosis of sepsis. Daughter lives in the home. Mother is Amanda CockayneJean Motley [phone]). States the last physician he seen [facility] about 8 months ago. Doesn't work but has [employer] as his insurance. Takes care of all basic activities of daily living himself, drives. Discussed physicians accepting new patient. Hard copy of information given. Encouraged to call physician's office prior to discharge, Gwenette GreetBrenda S Ketara Cavness RN MSN CCM Care Management [phone]

## 2017-05-22 NOTE — Plan of Care (Signed)
Problem: Pain Managment: Goal: General experience of comfort will improve Outcome: Progressing Patient continues to require oxycodone and flexeril.

## 2017-05-22 NOTE — Progress Notes (Addendum)
[facility] - [place] at [facility]   PATIENT NAME: Jeff Patton    MR#:  [id number]  DATE OF BIRTH:  1966/06/05  SUBJECTIVE:  CHIEF COMPLAINT:  Patient is reporting shortness of breath is better, reporting right upper quadrant and right back pain. Admits smoking 1 pack a day  REVIEW OF SYSTEMS:  CONSTITUTIONAL: No fever, fatigue or weakness.  EYES: No blurred or double vision.  EARS, NOSE, AND THROAT: No tinnitus or ear pain.  RESPIRATORY: Some cough, shortness of breath, denies wheezing or hemoptysis.  CARDIOVASCULAR: No chest pain, orthopnea, edema.  GASTROINTESTINAL: Reporting right abdominal and right back pain No nausea, vomiting, diarrhea GENITOURINARY: No dysuria, hematuria.  ENDOCRINE: No polyuria, nocturia,  HEMATOLOGY: No anemia, easy bruising or bleeding SKIN: No rash or lesion. MUSCULOSKELETAL: No joint pain or arthritis.   NEUROLOGIC: No tingling, numbness, weakness.  PSYCHIATRY: No anxiety or depression.   DRUG ALLERGIES:  No Known Allergies  VITALS:  Blood pressure 132/83, pulse (!) 102, temperature 99.5 F (37.5 C), temperature source Oral, resp. rate 18, height 5\' 10"  (1.778 m), weight 83.9 kg (185 lb), SpO2 96 %.  PHYSICAL EXAMINATION:  GENERAL:  51 y.o.-year-old patient lying in the bed with no acute distress.  EYES: Pupils equal, round, reactive to light and accommodation. No scleral icterus. Extraocular muscles intact.  HEENT: Head atraumatic, normocephalic. Oropharynx and nasopharynx clear.  NECK:  Supple, no jugular venous distention. No thyroid enlargement, no tenderness.  LUNGS: Moderate breath sounds bilaterally, no wheezing, rales,rhonchi or crepitation. No use of accessory muscles of respiration.  CARDIOVASCULAR: S1, S2 normal. No murmurs, rubs, or gallops.  ABDOMEN: Soft, right upper quadrant reproducible tenderness noted wall tenderness, nondistended. Bowel sounds present. Right flank is tender EXTREMITIES: No pedal  edema, cyanosis, or clubbing.  NEUROLOGIC: Cranial nerves II through XII are intact. Muscle strength 5/5 in all extremities. Sensation intact. Gait not checked.  PSYCHIATRIC: The patient is alert and oriented x 3.  SKIN: No obvious rash, lesion, or ulcer.    LABORATORY PANEL:   CBC  Recent Labs Lab 05/22/17 0345  WBC 9.2  HGB 14.6  HCT 41.2  PLT 116*   ------------------------------------------------------------------------------------------------------------------  Chemistries   Recent Labs Lab 05/21/17 1138 05/22/17 0345  NA 133* 131*  K 3.9 3.6  CL 100* 99*  CO2 24 28  GLUCOSE 97 98  BUN 8 10  CREATININE 0.66 0.92  CALCIUM 8.8* 8.0*  AST 31  --   ALT 15*  --   ALKPHOS 77  --   BILITOT 1.9*  --    ------------------------------------------------------------------------------------------------------------------  Cardiac Enzymes  Recent Labs Lab 05/21/17 1418  TROPONINI <0.03   ------------------------------------------------------------------------------------------------------------------  RADIOLOGY:  Dg Chest 2 View  Result Date: 05/21/2017 CLINICAL DATA:  Smoker. Hx of HTN. Pt reports right side chest wall pain that was radiating to the front earlier this morning. Denies any injury. Pt arrived via EMS from home. Pt holding on the right side taking shallow breaths. Pt tachypneic.*comment was truncated* EXAM: CHEST  2 VIEW COMPARISON:  None. FINDINGS: Normal cardiac silhouette. There is linear passes is LEFT RIGHT lung base. No pleural fluid. No pulmonary edema the upper lobes. No pneumothorax. No osseous abnormality IMPRESSION: Bibasilar linear opacities with differential including atelectasis, infiltrate or interstitial edema. This Electronically Signed   By: Genevive Bi M.D.   On: 05/21/2017 11:50   Ct Renal Stone Study  Result Date: 05/21/2017 CLINICAL DATA:  Right flank pain. EXAM: CT ABDOMEN AND PELVIS WITHOUT CONTRAST TECHNIQUE:  Multidetector CT  imaging of the abdomen and pelvis was performed following the standard protocol without IV contrast. COMPARISON:  01/04/2017 FINDINGS: Lower chest: There are small patchy areas of infiltrate at the right lung base posteriorly. Tiny right effusion. Small hiatal hernia. Hepatobiliary: No focal liver abnormality is seen. No gallstones, gallbladder wall thickening, or biliary dilatation. Pancreas: Unremarkable. No pancreatic ductal dilatation or surrounding inflammatory changes. Spleen: Normal in size without focal abnormality. Adrenals/Urinary Tract: Adrenal glands are normal. 17 mm exophytic cyst on the mid right kidney, unchanged. Left kidney is normal. No hydronephrosis. Bladder appears normal. Stomach/Bowel: Small hiatal hernia. Scattered diverticula in the distal colon. No diverticulitis. Small bowel is normal. Appendix is normal. Vascular/Lymphatic: Aortic atherosclerosis. No enlarged abdominal or pelvic lymph nodes. Reproductive: Prostate is unremarkable. Other: No abdominal wall hernia or abnormality. No abdominopelvic ascites. Musculoskeletal: No acute or significant osseous findings. IMPRESSION: 1. Small patchy areas of infiltrate posteriorly at the right lung base with a tiny right effusion. Is the patient febrile? 2. Scattered diverticula in the distal colon without evidence of diverticulitis. 3. No renal or ureteral or bladder calculi. Electronically Signed   By: Francene BoyersJames  Maxwell M.D.   On: 05/21/2017 12:16   [place] Abdomen Limited Ruq  Result Date: 05/21/2017 CLINICAL DATA:  Right upper quadrant pain since this morning. EXAM: ULTRASOUND ABDOMEN LIMITED RIGHT UPPER QUADRANT COMPARISON:  CT 05/21/2017 and 01/04/2017 FINDINGS: Patient could not lay supine as imaging performed in the semi-erect position. Gallbladder: No gallstones or wall thickening visualized. No sonographic Murphy sign noted by sonographer. Common bile duct: Diameter: 2.0 mm. Liver: No focal lesion identified. Within normal limits in  parenchymal echogenicity. IMPRESSION: No acute findings. Electronically Signed   By: Elberta Fortisaniel  Boyle M.D.   On: 05/21/2017 15:35    EKG:   Orders placed or performed during the hospital encounter of 05/21/17  . ED EKG within 10 minutes  . ED EKG within 10 minutes    ASSESSMENT AND PLAN:     #  CAP (community acquired pneumonia) - Lactic acid is in the normal range at 1.0 no leukocytosis  Continue IV antibiotic Rocephin and azithromycin Sputum culture and sensitivity if it is collected Blood cultures are negative so far    #Right upper quadrant and right back pain-seems to be musculoskeletal Ultrasound of the abdomen is negative Pain management and muscle relaxants as needed  will get urine culture and sensitivity CT renal stone study , no renal stones  #Essential hypertension Not on home medications at this point. Blood pressure is stable. Will provide low-salt diet   #Tobacco abuse disorder consult patient to quit smoking for 5 minutes. He verbalized understanding. We will provide nicotine patch   All the records are reviewed and case discussed with Care Management/Social Workerr. Management plans discussed with the patient, family and they are in agreement.  CODE STATUS: fc   TOTAL TIME TAKING CARE OF THIS PATIENT: 35  minutes.   POSSIBLE D/C IN 2  DAYS, DEPENDING ON CLINICAL CONDITION.  Note: This dictation was prepared with Dragon dictation along with smaller phrase technology. Any transcriptional errors that result from this process are unintentional.   Ramonita LabGouru, Juergen Hardenbrook M.D on 05/22/2017 at 10:36 AM  Between 7am to 6pm - Pager - [phone] After 6pm go to [email or web address] - password [email or web address]  [facility]  Office  [phone]  CC: Primary care physician; Patient, No Pcp Per

## 2017-05-23 ENCOUNTER — Inpatient Hospital Stay: Payer: Commercial Managed Care - PPO

## 2017-05-23 LAB — URINE CULTURE
CULTURE: NO GROWTH
Special Requests: NORMAL

## 2017-05-23 LAB — HIV ANTIBODY (ROUTINE TESTING W REFLEX): HIV Screen 4th Generation wRfx: NONREACTIVE

## 2017-05-23 MED ORDER — AZITHROMYCIN 500 MG PO TABS
500.0000 mg | ORAL_TABLET | Freq: Every day | ORAL | Status: AC
  Administered 2017-05-23 – 2017-05-25 (×3): 500 mg via ORAL
  Filled 2017-05-23 (×4): qty 1

## 2017-05-23 MED ORDER — APIXABAN 5 MG PO TABS
10.0000 mg | ORAL_TABLET | Freq: Two times a day (BID) | ORAL | Status: DC
  Administered 2017-05-23 – 2017-05-26 (×7): 10 mg via ORAL
  Filled 2017-05-23 (×8): qty 2

## 2017-05-23 MED ORDER — DOCUSATE SODIUM 100 MG PO CAPS
100.0000 mg | ORAL_CAPSULE | Freq: Two times a day (BID) | ORAL | Status: DC
  Administered 2017-05-23 – 2017-05-26 (×7): 100 mg via ORAL
  Filled 2017-05-23 (×8): qty 1

## 2017-05-23 MED ORDER — APIXABAN 5 MG PO TABS: 5.0000 mg | ORAL_TABLET | Freq: Two times a day (BID) | ORAL | Status: DC

## 2017-05-23 MED ORDER — IOPAMIDOL (ISOVUE-370) INJECTION 76%
75.0000 mL | Freq: Once | INTRAVENOUS | Status: AC | PRN
  Administered 2017-05-23: 10:00:00 75 mL via INTRAVENOUS

## 2017-05-23 NOTE — Progress Notes (Signed)
ANTICOAGULATION CONSULT NOTE  Pharmacy Consult for apixaban Indication: pulmonary embolus  No Known Allergies  Patient Measurements: Height: 5\' 10"  (177.8 cm) Weight: 185 lb (83.9 kg) IBW/kg (Calculated) : 73  Vital Signs: Temp: 98.1 F (36.7 C) (06/26 0328) Temp Source: Oral (06/26 0328) BP: 132/80 (06/26 0328) Pulse Rate: 114 (06/26 0328)  Labs:  Recent Labs  05/21/17 1138 05/21/17 1418 05/22/17 0345  HGB 16.6  --  14.6  HCT 47.7  --  41.2  PLT 147*  --  116*  CREATININE 0.66  --  0.92  TROPONINI <0.03 <0.03  --     Estimated Creatinine Clearance: 100.3 mL/min (by C-G formula based on SCr of 0.92 mg/dL).   Medical History: Past Medical History:  Diagnosis Date  . Hypertension    Assessment: 51 y/o M admitted for SOB and found to have PE.   Plan:  Apixaban 10 mg bid x 7 days followed by 5 mg bid thereafter.   Luisa Harthristy, Yotam Rhine D 05/23/2017,11:50 AM

## 2017-05-23 NOTE — Progress Notes (Signed)
Made Dr. Luberta MutterKonidena aware that patient's chest CT was positive for PE.  New orders to follow.  Orson Apeanielle Jake Fuhrmann, RN

## 2017-05-23 NOTE — Progress Notes (Signed)
[facility] - [place] at [facility]   PATIENT NAME: Jeff Patton    MR#:  [id number]  DATE OF BIRTH:  Apr 10, 1966  SUBJECTIVE: Patient is having pleuritic chest pain on the right side and is in moderate discomfort, also tachycardic. Says that the pain is more when he takes deep breath and also coughs, pain medicine, Flexeril helps only a little.   CHIEF COMPLAINT:  Patient is reporting shortness of breath is better, reporting right upper quadrant and right back pain. Admits smoking 1 pack a day  REVIEW OF SYSTEMS:  CONSTITUTIONAL: No fever, fatigue or weakness.  EYES: No blurred or double vision.  EARS, NOSE, AND THROAT: No tinnitus or ear pain.  RESPIRATORY: Some cough,  CARDIOVASCULAR: She has pleuritic chest pain on the right side below the right breast. GASTROINTESTINAL: No abdominal pain, nausea, vomiting. GENITOURINARY: No dysuria, hematuria.  ENDOCRINE: No polyuria, nocturia,  HEMATOLOGY: No anemia, easy bruising or bleeding SKIN: No rash or lesion. MUSCULOSKELETAL: No joint pain or arthritis.   NEUROLOGIC: No tingling, numbness, weakness.  PSYCHIATRY: No anxiety or depression.   DRUG ALLERGIES:  No Known Allergies  VITALS:  Blood pressure 132/80, pulse (!) 114, temperature 98.1 F (36.7 C), temperature source Oral, resp. rate 18, height 5\' 10"  (1.778 m), weight 83.9 kg (185 lb), SpO2 94 %.  PHYSICAL EXAMINATION:  GENERAL:  51 y.o.-year-old patient lying in the bed withSome discomfort because of pleuritic chest pain.  EYES: Pupils equal, round, reactive to light and accommodation. No scleral icterus. Extraocular muscles intact.  HEENT: Head atraumatic, normocephalic. Oropharynx and nasopharynx clear.  NECK:  Supple, no jugular venous distention. No thyroid enlargement, no tenderness.  LUNGS: Moderate breath sounds bilaterally, no wheezing, rales,rhonchi or crepitation. No use of accessory muscles of respiration.  CARDIOVASCULAR: S1, S2 normal. No  murmurs, rubs, or gallops.  ABDOMEN: Soft, right upper quadrant reproducible tenderness noted wall tenderness, nondistended. Bowel sounds present. Right flank is tender EXTREMITIES: No pedal edema, cyanosis, or clubbing.  NEUROLOGIC: Cranial nerves II through XII are intact. Muscle strength 5/5 in all extremities. Sensation intact. Gait not checked.  PSYCHIATRIC: The patient is alert and oriented x 3.  SKIN: No obvious rash, lesion, or ulcer.    LABORATORY PANEL:   CBC  Recent Labs Lab 05/22/17 0345  WBC 9.2  HGB 14.6  HCT 41.2  PLT 116*   ------------------------------------------------------------------------------------------------------------------  Chemistries   Recent Labs Lab 05/21/17 1138 05/22/17 0345  NA 133* 131*  K 3.9 3.6  CL 100* 99*  CO2 24 28  GLUCOSE 97 98  BUN 8 10  CREATININE 0.66 0.92  CALCIUM 8.8* 8.0*  AST 31  --   ALT 15*  --   ALKPHOS 77  --   BILITOT 1.9*  --    ------------------------------------------------------------------------------------------------------------------  Cardiac Enzymes  Recent Labs Lab 05/21/17 1418  TROPONINI <0.03   ------------------------------------------------------------------------------------------------------------------  RADIOLOGY:  Dg Chest 2 View  Result Date: 05/21/2017 CLINICAL DATA:  Smoker. Hx of HTN. Pt reports right side chest wall pain that was radiating to the front earlier this morning. Denies any injury. Pt arrived via EMS from home. Pt holding on the right side taking shallow breaths. Pt tachypneic.*comment was truncated* EXAM: CHEST  2 VIEW COMPARISON:  None. FINDINGS: Normal cardiac silhouette. There is linear passes is LEFT RIGHT lung base. No pleural fluid. No pulmonary edema the upper lobes. No pneumothorax. No osseous abnormality IMPRESSION: Bibasilar linear opacities with differential including atelectasis, infiltrate or interstitial edema. This Electronically Signed  By: Genevive BiStewart   Edmunds M.D.   On: 05/21/2017 11:50   Ct Renal Stone Study  Result Date: 05/21/2017 CLINICAL DATA:  Right flank pain. EXAM: CT ABDOMEN AND PELVIS WITHOUT CONTRAST TECHNIQUE: Multidetector CT imaging of the abdomen and pelvis was performed following the standard protocol without IV contrast. COMPARISON:  01/04/2017 FINDINGS: Lower chest: There are small patchy areas of infiltrate at the right lung base posteriorly. Tiny right effusion. Small hiatal hernia. Hepatobiliary: No focal liver abnormality is seen. No gallstones, gallbladder wall thickening, or biliary dilatation. Pancreas: Unremarkable. No pancreatic ductal dilatation or surrounding inflammatory changes. Spleen: Normal in size without focal abnormality. Adrenals/Urinary Tract: Adrenal glands are normal. 17 mm exophytic cyst on the mid right kidney, unchanged. Left kidney is normal. No hydronephrosis. Bladder appears normal. Stomach/Bowel: Small hiatal hernia. Scattered diverticula in the distal colon. No diverticulitis. Small bowel is normal. Appendix is normal. Vascular/Lymphatic: Aortic atherosclerosis. No enlarged abdominal or pelvic lymph nodes. Reproductive: Prostate is unremarkable. Other: No abdominal wall hernia or abnormality. No abdominopelvic ascites. Musculoskeletal: No acute or significant osseous findings. IMPRESSION: 1. Small patchy areas of infiltrate posteriorly at the right lung base with a tiny right effusion. Is the patient febrile? 2. Scattered diverticula in the distal colon without evidence of diverticulitis. 3. No renal or ureteral or bladder calculi. Electronically Signed   By: Francene BoyersJames  Maxwell M.D.   On: 05/21/2017 12:16   [place] Abdomen Limited Ruq  Result Date: 05/21/2017 CLINICAL DATA:  Right upper quadrant pain since this morning. EXAM: ULTRASOUND ABDOMEN LIMITED RIGHT UPPER QUADRANT COMPARISON:  CT 05/21/2017 and 01/04/2017 FINDINGS: Patient could not lay supine as imaging performed in the semi-erect position. Gallbladder: No  gallstones or wall thickening visualized. No sonographic Murphy sign noted by sonographer. Common bile duct: Diameter: 2.0 mm. Liver: No focal lesion identified. Within normal limits in parenchymal echogenicity. IMPRESSION: No acute findings. Electronically Signed   By: Elberta Fortisaniel  Boyle M.D.   On: 05/21/2017 15:35    EKG:   Orders placed or performed during the hospital encounter of 05/21/17  . ED EKG within 10 minutes  . ED EKG within 10 minutes    ASSESSMENT AND PLAN:     #  CAP (community acquired pneumonia) - Lactic acid is in the normal range at 1.0 no leukocytosis  Continue IV antibiotic Rocephin and azithromycin And has pleuritic chest pain on the right side likely secondary to pneumonia but because of tachycardia and significant pain in the right chest ,I added a CT of the chest to rule out PE. Discussed  this with patient and also registered nurse. Blood cultures are negative so far     #Essential hypertension Not on home medications at this point. Blood pressure is stable. Will provide low-salt diet   #Tobacco abuse disorder consult patient to quit smoking for 5 minutes. He verbalized understanding. We will provide nicotine patch D/w RN  All the records are reviewed and case discussed with Care Management/Social Workerr. Management plans discussed with the patient, family and they are in agreement.  CODE STATUS: fc   TOTAL TIME TAKING CARE OF THIS PATIENT: 35  minutes.   POSSIBLE D/C IN 2  DAYS, DEPENDING ON CLINICAL CONDITION.  Note: This dictation was prepared with Dragon dictation along with smaller phrase technology. Any transcriptional errors that result from this process are unintentional.   Pryce Folts M.D on 05/23/2017 at 7:19 AM  Between 7am to 6pm - Pager - [phone] After 6pm go to [email or web address] - password [email or web address] [facility]  [place]  [place] [facility]  Office  [phone]  CC: Primary care physician; Patient, No Pcp Per

## 2017-05-23 NOTE — Plan of Care (Signed)
Problem: Pain Managment: Goal: General experience of comfort will improve Outcome: Progressing Pt continues to require pain medication

## 2017-05-24 NOTE — Progress Notes (Signed)
[facility] -  at [facility]   PATIENT NAME: Jeff Patton    MR#:  [id number]  DATE OF BIRTH:  29-Sep-1966  Patient is a diagnosed with PE and DVT. Started on Eliquis.. Still has the right pleuritic chest pain but it is slightly better than yesterday. Noted to have small amount of hemoptysis.   CHIEF COMPLAINT:  Admitted for pneumonia ].  REVIEW OF SYSTEMS:  CONSTITUTIONAL: No fever, fatigue or weakness.  EYES: No blurred or double vision.  EARS, NOSE, AND THROAT: No tinnitus or ear pain.  RESPIRATORY: Some cough,  CARDIOVASCULAR: She has pleuritic chest pain on the right side below the right breast. GASTROINTESTINAL: No abdominal pain, nausea, vomiting. GENITOURINARY: No dysuria, hematuria.  ENDOCRINE: No polyuria, nocturia,  HEMATOLOGY: No anemia, easy bruising or bleeding SKIN: No rash or lesion. MUSCULOSKELETAL: No joint pain or arthritis.   NEUROLOGIC: No tingling, numbness, weakness.  PSYCHIATRY: No anxiety or depression.   DRUG ALLERGIES:  No Known Allergies  VITALS:  Blood pressure 119/70, pulse 93, temperature (!) 100.5 F (38.1 C), temperature source Oral, resp. rate 20, height 5\' 10"  (1.778 m), weight 83.9 kg (185 lb), SpO2 97 %.  PHYSICAL EXAMINATION:  GENERAL:  51 y.o.-year-old patient lying in the bed withSome discomfort because of pleuritic chest pain.  EYES: Pupils equal, round, reactive to light and accommodation. No scleral icterus. Extraocular muscles intact.  HEENT: Head atraumatic, normocephalic. Oropharynx and nasopharynx clear.  NECK:  Supple, no jugular venous distention. No thyroid enlargement, no tenderness.  LUNGS: Moderate breath sounds bilaterally, no wheezing, rales,rhonchi or crepitation. No use of accessory muscles of respiration.  CARDIOVASCULAR: S1, S2 normal. No murmurs, rubs, or gallops.  ABDOMEN: Soft, right upper quadrant reproducible tenderness noted wall tenderness, nondistended. Bowel sounds present.  Right flank is tender EXTREMITIES: No pedal edema, cyanosis, or clubbing.  NEUROLOGIC: Cranial nerves II through XII are intact. Muscle strength 5/5 in all extremities. Sensation intact. Gait not checked.  PSYCHIATRIC: The patient is alert and oriented x 3.  SKIN: No obvious rash, lesion, or ulcer.    LABORATORY PANEL:   CBC  Recent Labs Lab 05/22/17 0345  WBC 9.2  HGB 14.6  HCT 41.2  PLT 116*   ------------------------------------------------------------------------------------------------------------------  Chemistries   Recent Labs Lab 05/21/17 1138 05/22/17 0345  NA 133* 131*  K 3.9 3.6  CL 100* 99*  CO2 24 28  GLUCOSE 97 98  BUN 8 10  CREATININE 0.66 0.92  CALCIUM 8.8* 8.0*  AST 31  --   ALT 15*  --   ALKPHOS 77  --   BILITOT 1.9*  --    ------------------------------------------------------------------------------------------------------------------  Cardiac Enzymes  Recent Labs Lab 05/21/17 1418  TROPONINI <0.03   ------------------------------------------------------------------------------------------------------------------  RADIOLOGY:  Ct Angio Chest Pe W Or Wo Contrast  Result Date: 05/23/2017 CLINICAL DATA:  Pleuritic chest pain.  Shortness of breath. EXAM: CT ANGIOGRAPHY CHEST WITH CONTRAST TECHNIQUE: Multidetector CT imaging of the chest was performed using the standard protocol during bolus administration of intravenous contrast. Multiplanar CT image reconstructions and MIPs were obtained to evaluate the vascular anatomy. CONTRAST:  75 cc Isovue 370 COMPARISON:  Chest x-ray dated 05/21/2017 and chest CT dated 07/27/2008 FINDINGS: Cardiovascular/Lungs/Pleura: : There is a single segmental pulmonary embolus at the right lung base with peripheral hazy infiltrate suggesting a pulmonary infarct. There is consolidation in the right lower lobe with a moderate loculated pleural effusion. Slight atelectasis at the left lung base. No left effusion. Heart  size is  normal. Mediastinum/Nodes: No enlarged mediastinal, hilar, or axillary lymph nodes. Thyroid gland, trachea, and esophagus demonstrate no significant findings. Upper Abdomen: Normal. Musculoskeletal: No chest wall abnormality. No acute or significant osseous findings. Review of the MIP images confirms the above findings. IMPRESSION: 1. Small segmental pulmonary embolus in the right lower lobe with possible peripheral pulmonary infarct. 2. Consolidation in the right lower lobe with loculated pleural effusion. Is the patient febrile? 3. Slight atelectasis at the left lung base. Electronically Signed   By: Francene Boyers M.D.   On: 05/23/2017 10:29   US Venous Img Lower Bilateral  Result Date: 05/23/2017 CLINICAL DATA:  Shortness of breath. EXAM: BILATERAL LOWER EXTREMITY VENOUS DOPPLER ULTRASOUND TECHNIQUE: Gray-scale sonography with graded compression, as well as color Doppler and duplex ultrasound were performed to evaluate the lower extremity deep venous systems from the level of the common femoral vein and including the common femoral, femoral, profunda femoral, popliteal and calf veins including the posterior tibial, peroneal and gastrocnemius veins when visible. The superficial great saphenous vein was also interrogated. Spectral Doppler was utilized to evaluate flow at rest and with distal augmentation maneuvers in the common femoral, femoral and popliteal veins. COMPARISON:  CT 05/23/2017. FINDINGS: RIGHT LOWER EXTREMITY Common Femoral Vein: No evidence of thrombus. Normal compressibility, respiratory phasicity and response to augmentation. Saphenofemoral Junction: No evidence of thrombus. Normal compressibility and flow on color Doppler imaging. Profunda Femoral Vein: No evidence of thrombus. Normal compressibility and flow on color Doppler imaging. Femoral Vein: No evidence of thrombus. Normal compressibility, respiratory phasicity and response to augmentation. Popliteal Vein: No evidence of  thrombus. Normal compressibility, respiratory phasicity and response to augmentation. Calf Veins: No evidence of thrombus. Normal compressibility and flow on color Doppler imaging. Superficial Great Saphenous Vein: No evidence of thrombus. Normal compressibility and flow on color Doppler imaging. Other Findings:  Rouleaux formation noted. LEFT LOWER EXTREMITY Common Femoral Vein: No evidence of thrombus. Normal compressibility, respiratory phasicity and response to augmentation. Saphenofemoral Junction: No evidence of thrombus. Normal compressibility and flow on color Doppler imaging. Profunda Femoral Vein: No evidence of thrombus. Normal compressibility and flow on color Doppler imaging. Femoral Vein: Nonocclusive thrombus noted in the left superficial femoral vein. Popliteal Vein: No evidence of thrombus. Normal compressibility, respiratory phasicity and response to augmentation. Calf Veins: No evidence of thrombus. Normal compressibility and flow on color Doppler imaging. Superficial Great Saphenous Vein: No evidence of thrombus. Normal compressibility and flow on color Doppler imaging. Other Findings:  Scratched Rouleaux formation noted. IMPRESSION: Positive exam for DVT left lower extremity with nonocclusive thrombus in the left femoral vein. Electronically Signed   By: Maisie Fus  Register   On: 05/23/2017 14:35    EKG:   Orders placed or performed during the hospital encounter of 05/21/17  . ED EKG within 10 minutes  . ED EKG within 10 minutes    ASSESSMENT AND PLAN:     #  CAP (community acquired pneumonia) - Lactic acid is in the normal range at 1.0 no leukocytosis  Continue IV antibiotic Rocephin and azithromycin Still has low-grade temperature.  Continue IV antibiotics and repeat blood cultures and sputum cultures - Acute  RLL pulmonary embolus in nad  left leg DVT. Discussed the risks and benefits of anticoagulation and also diagnosis with the patient. Started on Eliquis  No hypoxia and  hemodynamically stable .patient will be seen by pulmonary as well because of pulmonary infarct, pneumonia, PE and COPD.  #Essential hypertension Not on home medications at this point. Blood  pressure is stable. Will provide low-salt diet   #Tobacco abuse disorder consult patient to quit smoking for 5 minutes. He verbalized understanding. We will provide nicotine patch D/w RN  All the records are reviewed and case discussed with Care Management/Social Workerr. Management plans discussed with the patient, family and they are in agreement.  CODE STATUS: fc   TOTAL TIME TAKING CARE OF THIS PATIENT: 35  minutes.   POSSIBLE D/C IN 2-3  DAYS, DEPENDING ON CLINICAL CONDITION.  Note: This dictation was prepared with Dragon dictation along with smaller phrase technology. Any transcriptional errors that result from this process are unintentional.   Keigo Whalley M.D on 05/24/2017 at 10:18 AM  Between 7am to 6pm - Pager - [phone] After 6pm go to [email or web address] - password [email or web address]  [facility] [place] [facility]  Office  [phone]  CC: Primary care physician; Patient, No Pcp Per

## 2017-05-24 NOTE — Consult Note (Signed)
Pulmonary Critical Care  Initial Consult Note   Jeff Patton AOZ:[id number] DOB: 27-Jul-1966 DOA: 05/21/2017  Referring physician: Oralia Manis, MD PCP: Patient, No Pcp Per   Chief Complaint: PE  HPI: Jeff Patton is a 51 y.o. male with h/o HTN presented to the hospital with chest pain. Patient had some associated SOB with the pain. Patient states that pain is on the right side. He was diagnosed with pneumonia and on further evaluation he was noted to have PE on CT angio. Patient did have fevers and he had elevation of the WBC. Patient was started on abx for possible pneumonia and also he has been started on eliquis for the PE. He has noted some hemoptysis which is streaky at this time. No n/v/d or bloody stools. Patient has travelled to [place] recently no other long distance drives. He is not working currently   Review of Systems:  Constitutional:  No weight loss, night sweats, Fevers, chills, fatigue.  HEENT:  No headaches, nasal congestion, post nasal drip,  Cardio-vascular:  +chest pain,no palpitations  GI:  No heartburn, indigestion vomiting, diarrhea  Resp:  +shortness of breath. +coughing up of blood +wheezing Skin:  no rash or lesions.  Musculoskeletal:  No joint pain or swelling.   Remainder ROS performed and is unremarkable other than noted in HPI  Past Medical History:  Diagnosis Date  . Hypertension    Past Surgical History:  Procedure Laterality Date  . NO PAST SURGERIES     Social History:  reports that he has been smoking Cigarettes.  He has been smoking about 1.00 pack per day. He has never used smokeless tobacco. He reports that he does not drink alcohol or use drugs.  No Known Allergies  Family History  Problem Relation Age of Onset  . Hypertension Other     Prior to Admission medications   Medication Sig Start Date End Date Taking? Authorizing Provider  Ca Carbonate-Mag Hydroxide (ROLAIDS) 550-110 MG CHEW Chew 1 Dose by mouth as needed.    Yes [provider]  oxymetazoline (AFRIN) 0.05 % nasal spray Place 1 spray into both nostrils 2 (two) times daily as needed for congestion.   Yes [provider]  diazepam (VALIUM) 5 MG tablet Take 1 tablet (5 mg total) by mouth every 8 (eight) hours as needed for muscle spasms. Patient not taking: Reported on 05/21/2017 01/04/17 01/04/18  Rockne Menghini, MD  ibuprofen (ADVIL,MOTRIN) 800 MG tablet Take 1 tablet (800 mg total) by mouth every 8 (eight) hours as needed for mild pain or moderate pain (with food). Patient not taking: Reported on 05/21/2017 01/04/17   Rockne Menghini, MD  oxyCODONE (ROXICODONE) 5 MG immediate release tablet Take 1-2 tablets (5-10 mg total) by mouth every 4 (four) hours as needed for severe pain. Patient not taking: Reported on 05/21/2017 01/04/17   Rockne Menghini, MD   Physical Exam: Vitals:   05/23/17 1326 05/23/17 2035 05/24/17 0419 05/24/17 1415  BP: 108/67 131/70 119/70 131/73  Pulse: 96 (!) 102 93 95  Resp: 16 20 20 20   Temp: 99.4 F (37.4 C) 99.8 F (37.7 C) (!) 100.5 F (38.1 C) 98.2 F (36.8 C)  TempSrc: Oral Oral Oral Oral  SpO2: 97% 96% 97% 98%  Weight:      Height:        Wt Readings from Last 3 Encounters:  05/21/17 83.9 kg (185 lb)  01/04/17 81.6 kg (180 lb)    General:  Appears calm and comfortable Eyes: PERRL,  normal lids, irises & conjunctiva ENT: grossly normal hearing, lips & tongue Neck: no LAD, masses or thyromegaly Cardiovascular: RRR, no m/r/g. No LE edema. Respiratory: CTA bilaterally, no w/r/r. Normal respiratory effort. Abdomen: soft, nontender Skin: no rash or induration seen on limited exam Musculoskeletal: grossly normal tone BUE/BLE Psychiatric: grossly normal mood and affect Neurologic: grossly non-focal.          Labs on Admission:  Basic Metabolic Panel:  Recent Labs Lab 05/21/17 1138 05/22/17 0345  NA 133* 131*  K 3.9 3.6  CL 100* 99*  CO2 24 28  GLUCOSE 97 98  BUN 8 10   CREATININE 0.66 0.92  CALCIUM 8.8* 8.0*   Liver Function Tests:  Recent Labs Lab 05/21/17 1138  AST 31  ALT 15*  ALKPHOS 77  BILITOT 1.9*  PROT 7.0  ALBUMIN 3.5   No results for input(s): LIPASE, AMYLASE in the last 168 hours. No results for input(s): AMMONIA in the last 168 hours. CBC:  Recent Labs Lab 05/21/17 1138 05/22/17 0345  WBC 12.8* 9.2  HGB 16.6 14.6  HCT 47.7 41.2  MCV 106.5* 107.9*  PLT 147* 116*   Cardiac Enzymes:  Recent Labs Lab 05/21/17 1138 05/21/17 1418  TROPONINI <0.03 <0.03    BNP (last 3 results) No results for input(s): BNP in the last 8760 hours.  ProBNP (last 3 results) No results for input(s): PROBNP in the last 8760 hours.  CBG: No results for input(s): GLUCAP in the last 168 hours.  Radiological Exams on Admission: Ct Angio Chest Pe W Or Wo Contrast  Result Date: 05/23/2017 CLINICAL DATA:  Pleuritic chest pain.  Shortness of breath. EXAM: CT ANGIOGRAPHY CHEST WITH CONTRAST TECHNIQUE: Multidetector CT imaging of the chest was performed using the standard protocol during bolus administration of intravenous contrast. Multiplanar CT image reconstructions and MIPs were obtained to evaluate the vascular anatomy. CONTRAST:  75 cc Isovue 370 COMPARISON:  Chest x-ray dated 05/21/2017 and chest CT dated 07/27/2008 FINDINGS: Cardiovascular/Lungs/Pleura: : There is a single segmental pulmonary embolus at the right lung base with peripheral hazy infiltrate suggesting a pulmonary infarct. There is consolidation in the right lower lobe with a moderate loculated pleural effusion. Slight atelectasis at the left lung base. No left effusion. Heart size is normal. Mediastinum/Nodes: No enlarged mediastinal, hilar, or axillary lymph nodes. Thyroid gland, trachea, and esophagus demonstrate no significant findings. Upper Abdomen: Normal. Musculoskeletal: No chest wall abnormality. No acute or significant osseous findings. Review of the MIP images confirms the  above findings. IMPRESSION: 1. Small segmental pulmonary embolus in the right lower lobe with possible peripheral pulmonary infarct. 2. Consolidation in the right lower lobe with loculated pleural effusion. Is the patient febrile? 3. Slight atelectasis at the left lung base. Electronically Signed   By: Francene BoyersJames  Maxwell M.D.   On: 05/23/2017 10:29   [place] Venous Img Lower Bilateral  Result Date: 05/23/2017 CLINICAL DATA:  Shortness of breath. EXAM: BILATERAL LOWER EXTREMITY VENOUS DOPPLER ULTRASOUND TECHNIQUE: Gray-scale sonography with graded compression, as well as color Doppler and duplex ultrasound were performed to evaluate the lower extremity deep venous systems from the level of the common femoral vein and including the common femoral, femoral, profunda femoral, popliteal and calf veins including the posterior tibial, peroneal and gastrocnemius veins when visible. The superficial great saphenous vein was also interrogated. Spectral Doppler was utilized to evaluate flow at rest and with distal augmentation maneuvers in the common femoral, femoral and popliteal veins. COMPARISON:  CT 05/23/2017. FINDINGS: RIGHT LOWER  EXTREMITY Common Femoral Vein: No evidence of thrombus. Normal compressibility, respiratory phasicity and response to augmentation. Saphenofemoral Junction: No evidence of thrombus. Normal compressibility and flow on color Doppler imaging. Profunda Femoral Vein: No evidence of thrombus. Normal compressibility and flow on color Doppler imaging. Femoral Vein: No evidence of thrombus. Normal compressibility, respiratory phasicity and response to augmentation. Popliteal Vein: No evidence of thrombus. Normal compressibility, respiratory phasicity and response to augmentation. Calf Veins: No evidence of thrombus. Normal compressibility and flow on color Doppler imaging. Superficial Great Saphenous Vein: No evidence of thrombus. Normal compressibility and flow on color Doppler imaging. Other Findings:   Rouleaux formation noted. LEFT LOWER EXTREMITY Common Femoral Vein: No evidence of thrombus. Normal compressibility, respiratory phasicity and response to augmentation. Saphenofemoral Junction: No evidence of thrombus. Normal compressibility and flow on color Doppler imaging. Profunda Femoral Vein: No evidence of thrombus. Normal compressibility and flow on color Doppler imaging. Femoral Vein: Nonocclusive thrombus noted in the left superficial femoral vein. Popliteal Vein: No evidence of thrombus. Normal compressibility, respiratory phasicity and response to augmentation. Calf Veins: No evidence of thrombus. Normal compressibility and flow on color Doppler imaging. Superficial Great Saphenous Vein: No evidence of thrombus. Normal compressibility and flow on color Doppler imaging. Other Findings:  Scratched Rouleaux formation noted. IMPRESSION: Positive exam for DVT left lower extremity with nonocclusive thrombus in the left femoral vein. Electronically Signed   By: Maisie Fus  Register   On: 05/23/2017 14:35    EKG: Independently reviewed.  Assessment/Plan Principal Problem:   Sepsis (HCC) Active Problems:   CAP (community acquired pneumonia)   HTN (hypertension)   1. PE/DVT -would continue with eliquis at this time -will follow up in the office after discharge -since first episode will not need lifelong anticoagulation  2. Pneumonia -on abx will continue with present regimen - findings are more likely related to the PE   Code Status: full DVT Prophylaxis:on eliquis Family Communication: none Disposition Plan: home  Time spent:    I have personally obtained a history, examined the patient, evaluated laboratory and imaging results, formulated the assessment and plan and placed orders.  The Patient requires high complexity decision making for assessment and support.    Yevonne Pax, MD [facility] Pulmonary Critical Care Medicine Sleep Medicine

## 2017-05-25 MED ORDER — ALUM & MAG HYDROXIDE-SIMETH 200-200-20 MG/5ML PO SUSP
15.0000 mL | ORAL | Status: DC | PRN
  Administered 2017-05-25: 20:00:00 15 mL via ORAL
  Filled 2017-05-25: qty 30

## 2017-05-25 NOTE — Progress Notes (Signed)
[facility]  at [facility]   PATIENT NAME: Jeff Patton    MR#:  [id number]  DATE OF BIRTH:  08-08-1966  He is feeling better, less pleuritic pain today. Appears more comfortable.   CHIEF COMPLAINT:  Admitted for pneumonia ].  REVIEW OF SYSTEMS:  CONSTITUTIONAL: No fever, fatigue or weakness.  EYES: No blurred or double vision.  EARS, NOSE, AND THROAT: No tinnitus or ear pain.  RESPIRATORY: Some cough,  CARDIOVASCULAR: She has pleuritic chest pain on the right side below the right breast. GASTROINTESTINAL: No abdominal pain, nausea, vomiting. GENITOURINARY: No dysuria, hematuria.  ENDOCRINE: No polyuria, nocturia,  HEMATOLOGY: No anemia, easy bruising or bleeding SKIN: No rash or lesion. MUSCULOSKELETAL: No joint pain or arthritis.   NEUROLOGIC: No tingling, numbness, weakness.  PSYCHIATRY: No anxiety or depression.   DRUG ALLERGIES:  No Known Allergies  VITALS:  Blood pressure 120/76, pulse 97, temperature 98.3 F (36.8 C), temperature source Oral, resp. rate 20, height 5\' 10"  (1.778 m), weight 83.9 kg (185 lb), SpO2 98 %.  PHYSICAL EXAMINATION:  GENERAL:  51 y.o.-year-old patient lying in the bed With no distress  EYES: Pupils equal, round, reactive to light . No scleral icterus. Extraocular muscles intact.  HEENT: Head atraumatic, normocephalic. Oropharynx and nasopharynx clear.  NECK:  Supple, no jugular venous distention. No thyroid enlargement, no tenderness.  LUNGS: Moderate breath sounds bilaterally, no wheezing, rales,rhonchi or crepitation. No use of accessory muscles of respiration.  CARDIOVASCULAR: S1, S2 normal. No murmurs, rubs, or gallops.  ABDOMEN: Soft, right upper quadrant reproducible tenderness noted wall tenderness, nondistended. Bowel sounds present. Right flank is tender EXTREMITIES: No pedal edema, cyanosis, or clubbing.  NEUROLOGIC: Cranial nerves II through XII are intact. Muscle strength 5/5 in all extremities.  Sensation intact. Gait not checked.  PSYCHIATRIC: The patient is alert and oriented x 3.  SKIN: No obvious rash, lesion, or ulcer.    LABORATORY PANEL:   CBC  Recent Labs Lab 05/22/17 0345  WBC 9.2  HGB 14.6  HCT 41.2  PLT 116*   ------------------------------------------------------------------------------------------------------------------  Chemistries   Recent Labs Lab 05/21/17 1138 05/22/17 0345  NA 133* 131*  K 3.9 3.6  CL 100* 99*  CO2 24 28  GLUCOSE 97 98  BUN 8 10  CREATININE 0.66 0.92  CALCIUM 8.8* 8.0*  AST 31  --   ALT 15*  --   ALKPHOS 77  --   BILITOT 1.9*  --    ------------------------------------------------------------------------------------------------------------------  Cardiac Enzymes  Recent Labs Lab 05/21/17 1418  TROPONINI <0.03   ------------------------------------------------------------------------------------------------------------------  RADIOLOGY:  Ct Angio Chest Pe W Or Wo Contrast  Result Date: 05/23/2017 CLINICAL DATA:  Pleuritic chest pain.  Shortness of breath. EXAM: CT ANGIOGRAPHY CHEST WITH CONTRAST TECHNIQUE: Multidetector CT imaging of the chest was performed using the standard protocol during bolus administration of intravenous contrast. Multiplanar CT image reconstructions and MIPs were obtained to evaluate the vascular anatomy. CONTRAST:  75 cc Isovue 370 COMPARISON:  Chest x-ray dated 05/21/2017 and chest CT dated 07/27/2008 FINDINGS: Cardiovascular/Lungs/Pleura: : There is a single segmental pulmonary embolus at the right lung base with peripheral hazy infiltrate suggesting a pulmonary infarct. There is consolidation in the right lower lobe with a moderate loculated pleural effusion. Slight atelectasis at the left lung base. No left effusion. Heart size is normal. Mediastinum/Nodes: No enlarged mediastinal, hilar, or axillary lymph nodes. Thyroid gland, trachea, and esophagus demonstrate no significant findings. Upper  Abdomen: Normal. Musculoskeletal: No chest wall abnormality.  No acute or significant osseous findings. Review of the MIP images confirms the above findings. IMPRESSION: 1. Small segmental pulmonary embolus in the right lower lobe with possible peripheral pulmonary infarct. 2. Consolidation in the right lower lobe with loculated pleural effusion. Is the patient febrile? 3. Slight atelectasis at the left lung base. Electronically Signed   By: Francene Boyers M.D.   On: 05/23/2017 10:29   US Venous Img Lower Bilateral  Result Date: 05/23/2017 CLINICAL DATA:  Shortness of breath. EXAM: BILATERAL LOWER EXTREMITY VENOUS DOPPLER ULTRASOUND TECHNIQUE: Gray-scale sonography with graded compression, as well as color Doppler and duplex ultrasound were performed to evaluate the lower extremity deep venous systems from the level of the common femoral vein and including the common femoral, femoral, profunda femoral, popliteal and calf veins including the posterior tibial, peroneal and gastrocnemius veins when visible. The superficial great saphenous vein was also interrogated. Spectral Doppler was utilized to evaluate flow at rest and with distal augmentation maneuvers in the common femoral, femoral and popliteal veins. COMPARISON:  CT 05/23/2017. FINDINGS: RIGHT LOWER EXTREMITY Common Femoral Vein: No evidence of thrombus. Normal compressibility, respiratory phasicity and response to augmentation. Saphenofemoral Junction: No evidence of thrombus. Normal compressibility and flow on color Doppler imaging. Profunda Femoral Vein: No evidence of thrombus. Normal compressibility and flow on color Doppler imaging. Femoral Vein: No evidence of thrombus. Normal compressibility, respiratory phasicity and response to augmentation. Popliteal Vein: No evidence of thrombus. Normal compressibility, respiratory phasicity and response to augmentation. Calf Veins: No evidence of thrombus. Normal compressibility and flow on color Doppler  imaging. Superficial Great Saphenous Vein: No evidence of thrombus. Normal compressibility and flow on color Doppler imaging. Other Findings:  Rouleaux formation noted. LEFT LOWER EXTREMITY Common Femoral Vein: No evidence of thrombus. Normal compressibility, respiratory phasicity and response to augmentation. Saphenofemoral Junction: No evidence of thrombus. Normal compressibility and flow on color Doppler imaging. Profunda Femoral Vein: No evidence of thrombus. Normal compressibility and flow on color Doppler imaging. Femoral Vein: Nonocclusive thrombus noted in the left superficial femoral vein. Popliteal Vein: No evidence of thrombus. Normal compressibility, respiratory phasicity and response to augmentation. Calf Veins: No evidence of thrombus. Normal compressibility and flow on color Doppler imaging. Superficial Great Saphenous Vein: No evidence of thrombus. Normal compressibility and flow on color Doppler imaging. Other Findings:  Scratched Rouleaux formation noted. IMPRESSION: Positive exam for DVT left lower extremity with nonocclusive thrombus in the left femoral vein. Electronically Signed   By: Maisie Fus  Register   On: 05/23/2017 14:35    EKG:   Orders placed or performed during the hospital encounter of 05/21/17  . ED EKG within 10 minutes  . ED EKG within 10 minutes    ASSESSMENT AND PLAN:     #  CAP (community acquired pneumonia) - Lactic acid is in the normal range at 1.0 no leukocytosis  Continue IV antibiotic Rocephin and azithromycin  Continue IV antibiotics till tomorrow, possible discharge home  tomorrow Acute  RLL pulmonary embolus in nad  left leg DVT. Also has right pulmonary infarct due to PE. Less pleuritic chest pain today. Will discontinue IV morphine, continue by mouth Percocet only.  Possible  discharge tomorrow. Discussed the risks and benefits of anticoagulation and also diagnosis with the patient. Started on Eliquis  No hypoxia and hemodynamically stable .patient will  be seen by pulmonary as well because of pulmonary infarct, pneumonia, PE and COPD.  #Essential hypertension Not on home medications at this point. Blood pressure is stable. Will provide  low-salt diet   #Tobacco abuse disorder consult patient to quit smoking for 5 minutes. He verbalized understanding. We will provide nicotine patch D/w RN  All the records are reviewed and case discussed with Care Management/Social Workerr. Management plans discussed with the patient, family and they are in agreement.  CODE STATUS: fc   TOTAL TIME TAKING CARE OF THIS PATIENT: 35  minutes.   POSSIBLE D/C IN 2-3  DAYS, DEPENDING ON CLINICAL CONDITION.  Note: This dictation was prepared with Dragon dictation along with smaller phrase technology. Any transcriptional errors that result from this process are unintentional.   Aryanah Enslow M.D on 05/25/2017 at 9:03 AM  Between 7am to 6pm - Pager - [phone] After 6pm go to [email or web address] - password [email or web address]  Fabio Neighbors[facility]  Office  [phone]  CC: Primary care physician; Patient, No Pcp Per

## 2017-05-26 LAB — CBC
HCT: 39.6 % — ABNORMAL LOW (ref 40.0–52.0)
Hemoglobin: 14 g/dL (ref 13.0–18.0)
MCH: 37.3 pg — ABNORMAL HIGH (ref 26.0–34.0)
MCHC: 35.3 g/dL (ref 32.0–36.0)
MCV: 105.8 fL — ABNORMAL HIGH (ref 80.0–100.0)
Platelets: 166 10*3/uL (ref 150–440)
RBC: 3.74 MIL/uL — ABNORMAL LOW (ref 4.40–5.90)
RDW: 14.8 % — AB (ref 11.5–14.5)
WBC: 5.7 10*3/uL (ref 3.8–10.6)

## 2017-05-26 LAB — CREATININE, SERUM
CREATININE: 0.84 mg/dL (ref 0.61–1.24)
GFR calc Af Amer: >60 mL/min (ref >60)

## 2017-05-26 LAB — CULTURE, BLOOD (ROUTINE X 2)
CULTURE: NO GROWTH
CULTURE: NO GROWTH
Special Requests: ADEQUATE
Special Requests: ADEQUATE

## 2017-05-26 MED ORDER — APIXABAN 5 MG PO TABS: 5.0000 mg | ORAL_TABLET | Freq: Two times a day (BID) | ORAL | 0 refills | Status: DC

## 2017-05-26 MED ORDER — GUAIFENESIN-DM 100-10 MG/5ML PO SYRP: 5.0000 mL | ORAL_SOLUTION | ORAL | 0 refills | Status: DC | PRN

## 2017-05-26 MED ORDER — OXYCODONE HCL 5 MG PO TABS: 5.0000 mg | ORAL_TABLET | ORAL | 0 refills | Status: DC | PRN

## 2017-05-26 MED ORDER — CYCLOBENZAPRINE HCL 5 MG PO TABS: 5.0000 mg | ORAL_TABLET | Freq: Three times a day (TID) | ORAL | 0 refills | Status: DC | PRN

## 2017-05-26 MED ORDER — APIXABAN 5 MG PO TABS: 5.0000 mg | ORAL_TABLET | Freq: Two times a day (BID) | ORAL | 2 refills | Status: DC

## 2017-05-26 MED ORDER — ALBUTEROL SULFATE HFA 108 (90 BASE) MCG/ACT IN AERS: 2.0000 | INHALATION_SPRAY | Freq: Four times a day (QID) | RESPIRATORY_TRACT | 2 refills | Status: DC | PRN

## 2017-05-26 NOTE — Care Management (Signed)
Eliquis coupon given Discharge to home today per Dr. Ace GinsKonidena Rejina Odle s Marcelle OverlieHolland RN MSN CCM Care Management [phone]

## 2017-05-26 NOTE — Progress Notes (Signed)
Pt being discharged home, discharge instructions and prescriptions reviewed with pt, states understanding, pt with no complaints 

## 2017-05-26 NOTE — Progress Notes (Signed)
Pnt is resting and appears comfortable. Pnt denied earlier in shift no SOB or pain. No concerns or issues at this time in the shift. Bed low and locked. Call bell in reach. Will continue to monitor and assess.

## 2017-05-29 NOTE — Discharge Summary (Signed)
Jeff Patton, is a 51 y.o. male  DOB [birth year]  MRN [id number].  Admission date:  05/21/2017  Admitting Physician  Oralia Manis, MD  Discharge Date:  05/26/2017   Primary MD  Patient, No Pcp Per  Recommendations for primary care physician for things to follow:      Admission Diagnosis  RUQ pain [R10.11] Sepsis, due to unspecified organism [facility]) [A41.9] Community acquired pneumonia of right lower lobe of lung (HCC) [J18.1]   Discharge Diagnosis  RUQ pain [R10.11] Sepsis, due to unspecified organism (HCC) [A41.9] Community acquired pneumonia of right lower lobe of lung (HCC) [J18.1]   Principal Problem:   Sepsis (HCC) Active Problems:   CAP (community acquired pneumonia)   HTN (hypertension)      Past Medical History:  Diagnosis Date  . Hypertension     Past Surgical History:  Procedure Laterality Date  . NO PAST SURGERIES         History of present illness and  Hospital Course:     Kindly see H&P for history of present illness and admission details, please review complete Labs, Consult reports and Test reports for all details in brief  HPI  from the history and physical done on the day of admission 51 year old male patient with history of COPD  Came  for pleuritic chest pain on the right side, shortness of breath. Found to have community-acquired pneumonia. And the patient is a admitted for the same   . Hospital Course  sepsis secondary to community-acquired pneumonia. Started on IV antibiotics, and was given Rocephin, Zithromax, patient finished 5 days of IV antibiotics while in the hospital so we did not give him any antibiotics at discharge   #2. right-sided pleuritic chest pain secondary to right lower lobe pulmonary emboli, DVT in the left leg. Discussed the risks and benefits of  anticoagulation with the patient, started on apixiban,. 3 .COPD: No wheezing, advised to quit smoking, seen by pulmonary physician Dr. Nickolas Madrid.   Discharge Condition: stable   Follow UP  Follow-up Information    Yevonne Pax, MD. Go on 06/12/2017.   Specialty:  Internal Medicine Why:  at 9:30 a.m. Contact information: [address] [place] [address] [phone]             Discharge Instructions  and  Discharge Medications      Allergies as of 05/26/2017   No Known Allergies     Medication List    STOP taking these medications   diazepam 5 MG tablet Commonly known as:  VALIUM   ibuprofen 800 MG tablet Commonly known as:  ADVIL,MOTRIN     TAKE these medications   albuterol 108 (90 Base) MCG/ACT inhaler Commonly known as:  PROVENTIL HFA;VENTOLIN HFA Inhale 2 puffs into the lungs every 6 (six) hours as needed for wheezing or shortness of breath.   apixaban 5 MG Tabs tablet Commonly known as:  ELIQUIS Take 1 tablet (5 mg total) by mouth 2 (two) times daily. Start taking on:  05/30/2017   cyclobenzaprine 5 MG tablet Commonly known as:  FLEXERIL Take 1 tablet (5 mg total) by mouth 3 (three) times daily as needed for muscle spasms.   guaiFENesin-dextromethorphan 100-10 MG/5ML syrup Commonly known as:  ROBITUSSIN DM Take 5 mLs by mouth every 4 (four) hours as needed for cough.   oxyCODONE 5 MG immediate release tablet Commonly known as:  ROXICODONE Take 1 tablet (5 mg total) by mouth every 4 (four) hours as needed for  moderate pain. What changed:  how much to take  reasons to take this   oxymetazoline 0.05 % nasal spray Commonly known as:  AFRIN Place 1 spray into both nostrils 2 (two) times daily as needed for congestion.   ROLAIDS 550-110 MG Chew Generic drug:  Ca Carbonate-Mag Hydroxide Chew 1 Dose by mouth as needed.         Diet and Activity recommendation: See Discharge Instructions above   Consults obtained -  pulmonary   Major procedures and Radiology Reports - PLEASE review detailed and final reports for all details, in brief -     Dg Chest 2 View  Result Date: 05/21/2017 CLINICAL DATA:  Smoker. Hx of HTN. Pt reports right side chest wall pain that was radiating to the front earlier this morning. Denies any injury. Pt arrived via EMS from home. Pt holding on the right side taking shallow breaths. Pt tachypneic.*comment was truncated* EXAM: CHEST  2 VIEW COMPARISON:  None. FINDINGS: Normal cardiac silhouette. There is linear passes is LEFT RIGHT lung base. No pleural fluid. No pulmonary edema the upper lobes. No pneumothorax. No osseous abnormality IMPRESSION: Bibasilar linear opacities with differential including atelectasis, infiltrate or interstitial edema. This Electronically Signed   By: Genevive BiStewart  Edmunds M.D.   On: 05/21/2017 11:50   Ct Angio Chest Pe W Or Wo Contrast  Result Date: 05/23/2017 CLINICAL DATA:  Pleuritic chest pain.  Shortness of breath. EXAM: CT ANGIOGRAPHY CHEST WITH CONTRAST TECHNIQUE: Multidetector CT imaging of the chest was performed using the standard protocol during bolus administration of intravenous contrast. Multiplanar CT image reconstructions and MIPs were obtained to evaluate the vascular anatomy. CONTRAST:  75 cc Isovue 370 COMPARISON:  Chest x-ray dated 05/21/2017 and chest CT dated 07/27/2008 FINDINGS: Cardiovascular/Lungs/Pleura: : There is a single segmental pulmonary embolus at the right lung base with peripheral hazy infiltrate suggesting a pulmonary infarct. There is consolidation in the right lower lobe with a moderate loculated pleural effusion. Slight atelectasis at the left lung base. No left effusion. Heart size is normal. Mediastinum/Nodes: No enlarged mediastinal, hilar, or axillary lymph nodes. Thyroid gland, trachea, and esophagus demonstrate no significant findings. Upper Abdomen: Normal. Musculoskeletal: No chest wall abnormality. No acute or significant  osseous findings. Review of the MIP images confirms the above findings. IMPRESSION: 1. Small segmental pulmonary embolus in the right lower lobe with possible peripheral pulmonary infarct. 2. Consolidation in the right lower lobe with loculated pleural effusion. Is the patient febrile? 3. Slight atelectasis at the left lung base. Electronically Signed   By: Francene BoyersJames  Maxwell M.D.   On: 05/23/2017 10:29   [place] Venous Img Lower Bilateral  Result Date: 05/23/2017 CLINICAL DATA:  Shortness of breath. EXAM: BILATERAL LOWER EXTREMITY VENOUS DOPPLER ULTRASOUND TECHNIQUE: Gray-scale sonography with graded compression, as well as color Doppler and duplex ultrasound were performed to evaluate the lower extremity deep venous systems from the level of the common femoral vein and including the common femoral, femoral, profunda femoral, popliteal and calf veins including the posterior tibial, peroneal and gastrocnemius veins when visible. The superficial great saphenous vein was also interrogated. Spectral Doppler was utilized to evaluate flow at rest and with distal augmentation maneuvers in the common femoral, femoral and popliteal veins. COMPARISON:  CT 05/23/2017. FINDINGS: RIGHT LOWER EXTREMITY Common Femoral Vein: No evidence of thrombus. Normal compressibility, respiratory phasicity and response to augmentation. Saphenofemoral Junction: No evidence of thrombus. Normal compressibility and flow on color Doppler imaging. Profunda Femoral Vein: No evidence of thrombus. Normal compressibility  and flow on color Doppler imaging. Femoral Vein: No evidence of thrombus. Normal compressibility, respiratory phasicity and response to augmentation. Popliteal Vein: No evidence of thrombus. Normal compressibility, respiratory phasicity and response to augmentation. Calf Veins: No evidence of thrombus. Normal compressibility and flow on color Doppler imaging. Superficial Great Saphenous Vein: No evidence of thrombus. Normal compressibility  and flow on color Doppler imaging. Other Findings:  Rouleaux formation noted. LEFT LOWER EXTREMITY Common Femoral Vein: No evidence of thrombus. Normal compressibility, respiratory phasicity and response to augmentation. Saphenofemoral Junction: No evidence of thrombus. Normal compressibility and flow on color Doppler imaging. Profunda Femoral Vein: No evidence of thrombus. Normal compressibility and flow on color Doppler imaging. Femoral Vein: Nonocclusive thrombus noted in the left superficial femoral vein. Popliteal Vein: No evidence of thrombus. Normal compressibility, respiratory phasicity and response to augmentation. Calf Veins: No evidence of thrombus. Normal compressibility and flow on color Doppler imaging. Superficial Great Saphenous Vein: No evidence of thrombus. Normal compressibility and flow on color Doppler imaging. Other Findings:  Scratched Rouleaux formation noted. IMPRESSION: Positive exam for DVT left lower extremity with nonocclusive thrombus in the left femoral vein. Electronically Signed   By: Maisie Fus  Register   On: 05/23/2017 14:35   Ct Renal Stone Study  Result Date: 05/21/2017 CLINICAL DATA:  Right flank pain. EXAM: CT ABDOMEN AND PELVIS WITHOUT CONTRAST TECHNIQUE: Multidetector CT imaging of the abdomen and pelvis was performed following the standard protocol without IV contrast. COMPARISON:  01/04/2017 FINDINGS: Lower chest: There are small patchy areas of infiltrate at the right lung base posteriorly. Tiny right effusion. Small hiatal hernia. Hepatobiliary: No focal liver abnormality is seen. No gallstones, gallbladder wall thickening, or biliary dilatation. Pancreas: Unremarkable. No pancreatic ductal dilatation or surrounding inflammatory changes. Spleen: Normal in size without focal abnormality. Adrenals/Urinary Tract: Adrenal glands are normal. 17 mm exophytic cyst on the mid right kidney, unchanged. Left kidney is normal. No hydronephrosis. Bladder appears normal.  Stomach/Bowel: Small hiatal hernia. Scattered diverticula in the distal colon. No diverticulitis. Small bowel is normal. Appendix is normal. Vascular/Lymphatic: Aortic atherosclerosis. No enlarged abdominal or pelvic lymph nodes. Reproductive: Prostate is unremarkable. Other: No abdominal wall hernia or abnormality. No abdominopelvic ascites. Musculoskeletal: No acute or significant osseous findings. IMPRESSION: 1. Small patchy areas of infiltrate posteriorly at the right lung base with a tiny right effusion. Is the patient febrile? 2. Scattered diverticula in the distal colon without evidence of diverticulitis. 3. No renal or ureteral or bladder calculi. Electronically Signed   By: Francene Boyers M.D.   On: 05/21/2017 12:16   US Abdomen Limited Ruq  Result Date: 05/21/2017 CLINICAL DATA:  Right upper quadrant pain since this morning. EXAM: ULTRASOUND ABDOMEN LIMITED RIGHT UPPER QUADRANT COMPARISON:  CT 05/21/2017 and 01/04/2017 FINDINGS: Patient could not lay supine as imaging performed in the semi-erect position. Gallbladder: No gallstones or wall thickening visualized. No sonographic Murphy sign noted by sonographer. Common bile duct: Diameter: 2.0 mm. Liver: No focal lesion identified. Within normal limits in parenchymal echogenicity. IMPRESSION: No acute findings. Electronically Signed   By: Elberta Fortis M.D.   On: 05/21/2017 15:35    Micro Results     Recent Results (from the past 240 hour(s))  Blood Culture (routine x 2)     Status: None   Collection Time: 05/21/17  3:27 PM  Result Value Ref Range Status   Specimen Description BLOOD BLOOD RIGHT FOREARM  Final   Special Requests   Final    BOTTLES DRAWN AEROBIC AND ANAEROBIC  Blood Culture adequate volume   Culture NO GROWTH 5 DAYS  Final   Report Status 05/26/2017 FINAL  Final  Blood Culture (routine x 2)     Status: None   Collection Time: 05/21/17  3:27 PM  Result Value Ref Range Status   Specimen Description BLOOD BLOOD LEFT WRIST   Final   Special Requests   Final    BOTTLES DRAWN AEROBIC AND ANAEROBIC Blood Culture adequate volume   Culture NO GROWTH 5 DAYS  Final   Report Status 05/26/2017 FINAL  Final  Urine Culture     Status: None   Collection Time: 05/22/17  1:21 PM  Result Value Ref Range Status   Specimen Description URINE, CLEAN CATCH  Final   Special Requests Normal  Final   Culture   Final    NO GROWTH Performed at [facility], [address] [place], [place] [address]    Report Status 05/23/2017 FINAL  Final       Today   Subjective:   Vida Roller today has no  Further pleuritic chest pain, eager  to go home. Objective:   Blood pressure 109/68, pulse 84, temperature 99 F (37.2 C), temperature source Oral, resp. rate 16, height 5\' 10"  (1.778 m), weight 83.9 kg (185 lb), SpO2 94 %.  No intake or output data in the 24 hours ending 05/29/17 1447  Exam Awake Alert, Oriented x 3, No new F.N deficits, Normal affect [place].AT,PERRAL Supple Neck,No JVD, No cervical lymphadenopathy appriciated.  Symmetrical Chest wall movement, Good air movement bilaterally, CTAB RRR,No Gallops,Rubs or new Murmurs, No Parasternal Heave +ve B.Sounds, Abd Soft, Non tender, No organomegaly appriciated, No rebound -guarding or rigidity. No Cyanosis, Clubbing or edema, No new Rash or bruise  Data Review   CBC w Diff:  Lab Results  Component Value Date   WBC 5.7 05/26/2017   HGB 14.0 05/26/2017   HCT 39.6 (L) 05/26/2017   PLT 166 05/26/2017    CMP:  Lab Results  Component Value Date   NA 131 (L) 05/22/2017   K 3.6 05/22/2017   CL 99 (L) 05/22/2017   CO2 28 05/22/2017   BUN 10 05/22/2017   CREATININE 0.84 05/26/2017   PROT 7.0 05/21/2017   ALBUMIN 3.5 05/21/2017   BILITOT 1.9 (H) 05/21/2017   ALKPHOS 77 05/21/2017   AST 31 05/21/2017   ALT 15 (L) 05/21/2017  .   Total Time in preparing paper work, data evaluation and todays exam - 35 minutes  Chivas Notz M.D on 05/26/2017 at 2:47  PM    Note: This dictation was prepared with Dragon dictation along with smaller phrase technology. Any transcriptional errors that result from this process are unintentional.

## 2017-08-02 ENCOUNTER — Other Ambulatory Visit: Payer: Self-pay | Admitting: Internal Medicine

## 2017-08-02 DIAGNOSIS — I2699 Other pulmonary embolism without acute cor pulmonale: Secondary | ICD-10-CM

## 2017-08-24 ENCOUNTER — Ambulatory Visit
Admission: RE | Admit: 2017-08-24 | Discharge: 2017-08-24 | Disposition: A | Discharge status: Home / Self Care | Payer: Commercial Managed Care - PPO | Source: Ambulatory Visit | Attending: Internal Medicine | Admitting: Internal Medicine

## 2017-08-24 DIAGNOSIS — I2699 Other pulmonary embolism without acute cor pulmonale: Secondary | ICD-10-CM

## 2017-08-24 DIAGNOSIS — J90 Pleural effusion, not elsewhere classified: Secondary | ICD-10-CM | POA: Diagnosis not present

## 2017-08-24 DIAGNOSIS — Z86711 Personal history of pulmonary embolism: Secondary | ICD-10-CM | POA: Diagnosis not present

## 2017-08-24 DIAGNOSIS — J439 Emphysema, unspecified: Secondary | ICD-10-CM | POA: Insufficient documentation

## 2017-08-24 MED ORDER — IOPAMIDOL (ISOVUE-370) INJECTION 76%
75.0000 mL | Freq: Once | INTRAVENOUS | Status: AC | PRN
  Administered 2017-08-24: 75 mL via INTRAVENOUS

## 2018-07-24 MED ORDER — ENOXAPARIN SODIUM 40 MG/0.4ML ~~LOC~~ SOLN
40.00 | SUBCUTANEOUS | Status: DC
Start: 2018-07-25 — End: 2018-07-24

## 2018-07-24 MED ORDER — GENERIC EXTERNAL MEDICATION
Status: DC
Start: ? — End: 2018-07-24

## 2018-07-24 MED ORDER — ASPIRIN 81 MG PO CHEW
81.00 | CHEWABLE_TABLET | ORAL | Status: DC
Start: 2018-07-25 — End: 2018-07-24

## 2018-07-24 MED ORDER — ATORVASTATIN CALCIUM 80 MG PO TABS
80.00 | ORAL_TABLET | ORAL | Status: DC
Start: 2018-07-25 — End: 2018-07-24

## 2018-07-24 MED ORDER — NICOTINE 21 MG/24HR TD PT24
1.00 | MEDICATED_PATCH | TRANSDERMAL | Status: DC
Start: 2018-07-25 — End: 2018-07-24

## 2020-07-27 ENCOUNTER — Emergency Department: Payer: Self-pay

## 2020-07-27 ENCOUNTER — Inpatient Hospital Stay
Admission: EM | Admit: 2020-07-27 | Discharge: 2020-08-02 | DRG: 093 | Disposition: A | Discharge status: Home Health | Payer: Self-pay | Attending: Family Medicine | Admitting: Family Medicine

## 2020-07-27 ENCOUNTER — Other Ambulatory Visit: Payer: Self-pay

## 2020-07-27 ENCOUNTER — Inpatient Hospital Stay: Payer: Self-pay

## 2020-07-27 ENCOUNTER — Encounter: Payer: Self-pay | Admitting: Emergency Medicine

## 2020-07-27 DIAGNOSIS — F17210 Nicotine dependence, cigarettes, uncomplicated: Secondary | ICD-10-CM | POA: Diagnosis present

## 2020-07-27 DIAGNOSIS — J449 Chronic obstructive pulmonary disease, unspecified: Secondary | ICD-10-CM | POA: Diagnosis present

## 2020-07-27 DIAGNOSIS — D7589 Other specified diseases of blood and blood-forming organs: Secondary | ICD-10-CM

## 2020-07-27 DIAGNOSIS — G629 Polyneuropathy, unspecified: Secondary | ICD-10-CM | POA: Diagnosis present

## 2020-07-27 DIAGNOSIS — F1010 Alcohol abuse, uncomplicated: Secondary | ICD-10-CM

## 2020-07-27 DIAGNOSIS — Z86711 Personal history of pulmonary embolism: Secondary | ICD-10-CM

## 2020-07-27 DIAGNOSIS — R531 Weakness: Secondary | ICD-10-CM

## 2020-07-27 DIAGNOSIS — Z7901 Long term (current) use of anticoagulants: Secondary | ICD-10-CM

## 2020-07-27 DIAGNOSIS — I10 Essential (primary) hypertension: Secondary | ICD-10-CM | POA: Diagnosis present

## 2020-07-27 DIAGNOSIS — R270 Ataxia, unspecified: Principal | ICD-10-CM

## 2020-07-27 DIAGNOSIS — E876 Hypokalemia: Secondary | ICD-10-CM | POA: Diagnosis present

## 2020-07-27 DIAGNOSIS — Z20822 Contact with and (suspected) exposure to covid-19: Secondary | ICD-10-CM | POA: Diagnosis present

## 2020-07-27 DIAGNOSIS — S0993XA Unspecified injury of face, initial encounter: Secondary | ICD-10-CM

## 2020-07-27 DIAGNOSIS — R7989 Other specified abnormal findings of blood chemistry: Secondary | ICD-10-CM

## 2020-07-27 DIAGNOSIS — R55 Syncope and collapse: Secondary | ICD-10-CM

## 2020-07-27 DIAGNOSIS — E538 Deficiency of other specified B group vitamins: Secondary | ICD-10-CM | POA: Diagnosis present

## 2020-07-27 DIAGNOSIS — R319 Hematuria, unspecified: Secondary | ICD-10-CM

## 2020-07-27 DIAGNOSIS — S0083XA Contusion of other part of head, initial encounter: Secondary | ICD-10-CM

## 2020-07-27 LAB — HEPATIC FUNCTION PANEL
ALT: 37 U/L (ref 0–44)
AST: 33 U/L (ref 15–41)
Albumin: 4.1 g/dL (ref 3.5–5.0)
Alkaline Phosphatase: 66 U/L (ref 38–126)
Bilirubin, Direct: 0.2 mg/dL (ref 0.0–0.2)
Indirect Bilirubin: 0.7 mg/dL (ref 0.3–0.9)
Total Bilirubin: 0.9 mg/dL (ref 0.3–1.2)
Total Protein: 8.1 g/dL (ref 6.5–8.1)

## 2020-07-27 LAB — BASIC METABOLIC PANEL
Anion gap: 11 (ref 5–15)
BUN: 10 mg/dL (ref 6–20)
CO2: 24 mmol/L (ref 22–32)
Calcium: 9.3 mg/dL (ref 8.9–10.3)
Chloride: 101 mmol/L (ref 98–111)
Creatinine, Ser: 0.87 mg/dL (ref 0.61–1.24)
GFR calc Af Amer: >60 mL/min (ref >60)
GFR calc non Af Amer: >60 mL/min (ref >60)
Glucose, Bld: 110 mg/dL — ABNORMAL HIGH (ref 70–99)
Potassium: 3.1 mmol/L — ABNORMAL LOW (ref 3.5–5.1)
Sodium: 136 mmol/L (ref 135–145)

## 2020-07-27 LAB — URINALYSIS, COMPLETE (UACMP) WITH MICROSCOPIC
Bacteria, UA: NONE SEEN
Bilirubin Urine: NEGATIVE
Glucose, UA: NEGATIVE mg/dL
Ketones, ur: NEGATIVE mg/dL
Leukocytes,Ua: NEGATIVE
Nitrite: NEGATIVE
Protein, ur: NEGATIVE mg/dL
Specific Gravity, Urine: 1.019 (ref 1.005–1.030)
Squamous Epithelial / HPF: NONE SEEN (ref 0–5)
pH: 5 (ref 5.0–8.0)

## 2020-07-27 LAB — MAGNESIUM: Magnesium: 2 mg/dL (ref 1.7–2.4)

## 2020-07-27 LAB — CBC
HCT: 47.3 % (ref 39.0–52.0)
Hemoglobin: 17.6 g/dL — ABNORMAL HIGH (ref 13.0–17.0)
MCH: 38.8 pg — ABNORMAL HIGH (ref 26.0–34.0)
MCHC: 37.2 g/dL — ABNORMAL HIGH (ref 30.0–36.0)
MCV: 104.2 fL — ABNORMAL HIGH (ref 80.0–100.0)
Platelets: 231 10*3/uL (ref 150–400)
RBC: 4.54 MIL/uL (ref 4.22–5.81)
RDW: 12.1 % (ref 11.5–15.5)
WBC: 10 10*3/uL (ref 4.0–10.5)
nRBC: 0 % (ref 0.0–0.2)

## 2020-07-27 LAB — URINE DRUG SCREEN, QUALITATIVE (ARMC ONLY)
Amphetamines, Ur Screen: NOT DETECTED
Barbiturates, Ur Screen: NOT DETECTED
Benzodiazepine, Ur Scrn: NOT DETECTED
Cannabinoid 50 Ng, Ur ~~LOC~~: NOT DETECTED
Cocaine Metabolite,Ur ~~LOC~~: NOT DETECTED
MDMA (Ecstasy)Ur Screen: NOT DETECTED
Methadone Scn, Ur: NOT DETECTED
Opiate, Ur Screen: NOT DETECTED
Phencyclidine (PCP) Ur S: NOT DETECTED
Tricyclic, Ur Screen: NOT DETECTED

## 2020-07-27 LAB — TSH: TSH: 1.4 u[IU]/mL (ref 0.350–4.500)

## 2020-07-27 LAB — AMMONIA: Ammonia: 38 umol/L — ABNORMAL HIGH (ref 9–35)

## 2020-07-27 LAB — TROPONIN I (HIGH SENSITIVITY): Troponin I (High Sensitivity): 4 ng/L (ref <18)

## 2020-07-27 LAB — SARS CORONAVIRUS 2 BY RT PCR (HOSPITAL ORDER, PERFORMED IN ~~LOC~~ HOSPITAL LAB): SARS Coronavirus 2: NEGATIVE

## 2020-07-27 LAB — VITAMIN B12: Vitamin B-12: 175 pg/mL — ABNORMAL LOW (ref 180–914)

## 2020-07-27 MED ORDER — TRAZODONE HCL 50 MG PO TABS: 50.0000 mg | ORAL_TABLET | Freq: Every evening | ORAL | Status: DC | PRN

## 2020-07-27 MED ORDER — METHOCARBAMOL 1000 MG/10ML IJ SOLN
500.0000 mg | Freq: Four times a day (QID) | INTRAVENOUS | Status: DC | PRN
  Filled 2020-07-27: qty 5

## 2020-07-27 MED ORDER — ONDANSETRON HCL 4 MG/2ML IJ SOLN: 4.0000 mg | Freq: Four times a day (QID) | INTRAMUSCULAR | Status: DC | PRN

## 2020-07-27 MED ORDER — LACTATED RINGERS IV BOLUS
1000.0000 mL | Freq: Once | INTRAVENOUS | Status: AC
  Administered 2020-07-27: 1000 mL via INTRAVENOUS

## 2020-07-27 MED ORDER — ALBUTEROL SULFATE (2.5 MG/3ML) 0.083% IN NEBU: 2.5000 mg | INHALATION_SOLUTION | RESPIRATORY_TRACT | Status: DC | PRN

## 2020-07-27 MED ORDER — ONDANSETRON HCL 4 MG PO TABS: 4.0000 mg | ORAL_TABLET | Freq: Four times a day (QID) | ORAL | Status: DC | PRN

## 2020-07-27 MED ORDER — NICOTINE 21 MG/24HR TD PT24
21.0000 mg | MEDICATED_PATCH | Freq: Every day | TRANSDERMAL | Status: DC
  Administered 2020-07-28 – 2020-08-02 (×7): 21 mg via TRANSDERMAL
  Filled 2020-07-27 (×7): qty 1

## 2020-07-27 MED ORDER — FOLIC ACID 1 MG PO TABS
1.0000 mg | ORAL_TABLET | Freq: Every day | ORAL | Status: DC
  Administered 2020-07-28 – 2020-08-02 (×6): 1 mg via ORAL
  Filled 2020-07-27 (×6): qty 1

## 2020-07-27 MED ORDER — PANTOPRAZOLE SODIUM 40 MG PO TBEC
40.0000 mg | DELAYED_RELEASE_TABLET | Freq: Every day | ORAL | Status: DC
  Administered 2020-07-28 – 2020-08-02 (×6): 40 mg via ORAL
  Filled 2020-07-27 (×6): qty 1

## 2020-07-27 MED ORDER — ACETAMINOPHEN 325 MG PO TABS
650.0000 mg | ORAL_TABLET | Freq: Four times a day (QID) | ORAL | Status: DC | PRN
  Administered 2020-07-28 – 2020-07-29 (×2): 650 mg via ORAL
  Filled 2020-07-27 (×2): qty 2

## 2020-07-27 MED ORDER — FOLIC ACID 1 MG PO TABS
1.0000 mg | ORAL_TABLET | Freq: Once | ORAL | Status: AC
  Administered 2020-07-27: 1 mg via ORAL
  Filled 2020-07-27: qty 1

## 2020-07-27 MED ORDER — LORAZEPAM 2 MG/ML IJ SOLN
0.0000 mg | Freq: Four times a day (QID) | INTRAMUSCULAR | Status: AC
  Administered 2020-07-27: 1 mg via INTRAVENOUS
  Filled 2020-07-27: qty 1

## 2020-07-27 MED ORDER — ENOXAPARIN SODIUM 40 MG/0.4ML ~~LOC~~ SOLN
40.0000 mg | SUBCUTANEOUS | Status: DC
  Administered 2020-07-27 – 2020-08-01 (×6): 40 mg via SUBCUTANEOUS
  Filled 2020-07-27 (×6): qty 0.4

## 2020-07-27 MED ORDER — THIAMINE HCL 100 MG/ML IJ SOLN
500.0000 mg | Freq: Three times a day (TID) | INTRAMUSCULAR | Status: DC
  Administered 2020-07-27: 500 mg via INTRAVENOUS
  Filled 2020-07-27: qty 6

## 2020-07-27 MED ORDER — THIAMINE HCL 100 MG/ML IJ SOLN
100.0000 mg | Freq: Every day | INTRAMUSCULAR | Status: DC
  Administered 2020-07-27: 100 mg via INTRAVENOUS
  Filled 2020-07-27: qty 2

## 2020-07-27 MED ORDER — LORAZEPAM 2 MG/ML IJ SOLN: 0.0000 mg | Freq: Two times a day (BID) | INTRAMUSCULAR | Status: DC

## 2020-07-27 MED ORDER — NICOTINE POLACRILEX 2 MG MT GUM
2.0000 mg | CHEWING_GUM | OROMUCOSAL | Status: DC | PRN
  Filled 2020-07-27: qty 1

## 2020-07-27 MED ORDER — THIAMINE HCL 100 MG PO TABS
100.0000 mg | ORAL_TABLET | Freq: Every day | ORAL | Status: DC
  Administered 2020-07-28 – 2020-08-02 (×6): 100 mg via ORAL
  Filled 2020-07-27 (×6): qty 1

## 2020-07-27 MED ORDER — POTASSIUM CHLORIDE CRYS ER 20 MEQ PO TBCR
40.0000 meq | EXTENDED_RELEASE_TABLET | Freq: Once | ORAL | Status: AC
  Administered 2020-07-27: 40 meq via ORAL
  Filled 2020-07-27: qty 2

## 2020-07-27 MED ORDER — SODIUM CHLORIDE 0.9% FLUSH
3.0000 mL | Freq: Two times a day (BID) | INTRAVENOUS | Status: DC
  Administered 2020-07-28 – 2020-08-02 (×9): 3 mL via INTRAVENOUS

## 2020-07-27 MED ORDER — LORAZEPAM 2 MG PO TABS: 0.0000 mg | ORAL_TABLET | Freq: Two times a day (BID) | ORAL | Status: DC

## 2020-07-27 MED ORDER — ADULT MULTIVITAMIN W/MINERALS CH
1.0000 | ORAL_TABLET | Freq: Every day | ORAL | Status: DC
  Administered 2020-07-27 – 2020-08-02 (×7): 1 via ORAL
  Filled 2020-07-27 (×7): qty 1

## 2020-07-27 MED ORDER — LORAZEPAM 2 MG/ML IJ SOLN
1.0000 mg | INTRAMUSCULAR | Status: AC | PRN
  Administered 2020-07-27 – 2020-07-28 (×2): 1 mg via INTRAVENOUS
  Filled 2020-07-27 (×2): qty 1

## 2020-07-27 MED ORDER — LORAZEPAM 1 MG PO TABS
1.0000 mg | ORAL_TABLET | ORAL | Status: AC | PRN
  Administered 2020-07-28 – 2020-07-30 (×2): 1 mg via ORAL
  Filled 2020-07-27 (×3): qty 1

## 2020-07-27 MED ORDER — ACETAMINOPHEN 650 MG RE SUPP: 650.0000 mg | Freq: Four times a day (QID) | RECTAL | Status: DC | PRN

## 2020-07-27 MED ORDER — LORAZEPAM 2 MG PO TABS
0.0000 mg | ORAL_TABLET | Freq: Four times a day (QID) | ORAL | Status: AC
  Administered 2020-07-28: 1 mg via ORAL
  Filled 2020-07-27: qty 1

## 2020-07-27 MED ORDER — POLYETHYLENE GLYCOL 3350 17 G PO PACK: 17.0000 g | PACK | Freq: Every day | ORAL | Status: DC | PRN

## 2020-07-27 MED ORDER — IPRATROPIUM BROMIDE 0.02 % IN SOLN: 0.5000 mg | Freq: Four times a day (QID) | RESPIRATORY_TRACT | Status: DC | PRN

## 2020-07-27 NOTE — ED Provider Notes (Signed)
[facility] Emergency Department Provider Note  ____________________________________________   First MD Initiated Contact with Patient 07/27/20 1626     (approximate)  I have reviewed the triage vital signs and the nursing notes.   HISTORY  Chief Complaint No chief complaint on file.   HPI Jeff Patton is a 54 y.o. male with a past medical history of COPD, remote subsegmental PE,  hypertension and prior stroke with some residual right hemibody weakness presents for assessment after possible syncopal episode and fall earlier today.  Patient states he is not currently taking any medications for his high blood pressure and he had some alcohol last night but that he is not a daily drinker.  He is unsure if he passed out or tripped or slipped states he fell and struck the front of his head and his lower lip.  He states that since then he feels his gait has been somewhat abnormal although he has been able to ambulate using a wall.  Endorses a chronic cough and tobacco abuse as well as at least several weeks of bilateral upper extremity tremors but denies any acute vertigo, chest pain, shortness of breath, vomiting, diarrhea, dysuria, rash, abdominal pain, back pain, extremity pain, or other acute complaints.  Patient does states something similar happened 1 month ago when he felt like his vision became tunnel like he passed out.  He denies illicit drug use.  No clear alleviating or aggravating factors.         Past Medical History:  Diagnosis Date  . Hypertension     Patient Active Problem List   Diagnosis Date Noted  . Ataxia 07/27/2020  . Sepsis (HCC) 05/21/2017  . CAP (community acquired pneumonia) 05/21/2017  . HTN (hypertension) 05/21/2017    Past Surgical History:  Procedure Laterality Date  . NO PAST SURGERIES      Prior to Admission medications   Medication Sig Start Date End Date Taking? Authorizing Provider  albuterol (PROVENTIL  HFA;VENTOLIN HFA) 108 (90 Base) MCG/ACT inhaler Inhale 2 puffs into the lungs every 6 (six) hours as needed for wheezing or shortness of breath. 05/26/17   Katha Hamming, MD  apixaban (ELIQUIS) 5 MG TABS tablet Take 1 tablet (5 mg total) by mouth 2 (two) times daily. 05/30/17   Katha Hamming, MD  Ca Carbonate-Mag Hydroxide (ROLAIDS) 550-110 MG CHEW Chew 1 Dose by mouth as needed.    [provider]  cyclobenzaprine (FLEXERIL) 5 MG tablet Take 1 tablet (5 mg total) by mouth 3 (three) times daily as needed for muscle spasms. 05/26/17   Katha Hamming, MD  guaiFENesin-dextromethorphan (ROBITUSSIN DM) 100-10 MG/5ML syrup Take 5 mLs by mouth every 4 (four) hours as needed for cough. 05/26/17   Katha Hamming, MD  oxyCODONE (ROXICODONE) 5 MG immediate release tablet Take 1 tablet (5 mg total) by mouth every 4 (four) hours as needed for moderate pain. 05/26/17   Katha Hamming, MD  oxymetazoline (AFRIN) 0.05 % nasal spray Place 1 spray into both nostrils 2 (two) times daily as needed for congestion.    [provider]    Allergies Patient has no known allergies.  Family History  Problem Relation Age of Onset  . Hypertension Other     Social History Social History   Tobacco Use  . Smoking status: Current Every Day Smoker    Packs/day: 1.00    Types: Cigarettes  . Smokeless tobacco: Never Used  Vaping Use  . Vaping Use: Former  Retail buyer  Topics  . Alcohol use: No  . Drug use: No    Review of Systems  Review of Systems  Constitutional: Negative for chills and fever.  HENT: Negative for sore throat.   Eyes: Negative for pain.  Respiratory: Negative for cough and stridor.   Cardiovascular: Negative for chest pain.  Gastrointestinal: Negative for vomiting.  Genitourinary: Negative for dysuria.  Musculoskeletal: Negative for myalgias.  Skin: Negative for rash.  Neurological: Positive for dizziness, tremors and headaches. Negative for  seizures and loss of consciousness.  Psychiatric/Behavioral: Negative for suicidal ideas.  All other systems reviewed and are negative.     ____________________________________________   PHYSICAL EXAM:  VITAL SIGNS: ED Triage Vitals  Enc Vitals Group     BP 07/27/20 0735 (!) 144/95     Pulse Rate 07/27/20 0735 93     Resp 07/27/20 0735 18     Temp 07/27/20 0735 98.5 F (36.9 C)     Temp Source 07/27/20 0735 Oral     SpO2 07/27/20 0735 98 %     Weight 07/27/20 0736 200 lb (90.7 kg)     Height 07/27/20 0736 5\' 10"  (1.778 m)     Head Circumference --      Peak Flow --      Pain Score --      Pain Loc --      Pain Edu? --      Excl. in GC? --    Vitals:   07/27/20 1824 07/27/20 2000  BP: (!) 145/94 129/83  Pulse: 66 72  Resp:  14  Temp:    SpO2:  96%   Physical Exam Vitals and nursing note reviewed.  Constitutional:      Appearance: He is well-developed.  HENT:     Head: Normocephalic and atraumatic.     Right Ear: External ear normal.     Left Ear: External ear normal.     Nose: Nose normal.  Eyes:     Conjunctiva/sclera: Conjunctivae normal.  Cardiovascular:     Rate and Rhythm: Normal rate and regular rhythm.     Heart sounds: No murmur heard.   Pulmonary:     Effort: Pulmonary effort is normal. No respiratory distress.     Breath sounds: Normal breath sounds.  Abdominal:     Palpations: Abdomen is soft.     Tenderness: There is no abdominal tenderness.  Musculoskeletal:     Cervical back: Neck supple.  Skin:    General: Skin is warm and dry.     Capillary Refill: Capillary refill takes less than 2 seconds.  Neurological:     Mental Status: He is alert.     Cranial Nerves: No cranial nerve deficit.     Motor: Tremor present.     Gait: Gait abnormal.  Psychiatric:        Mood and Affect: Mood normal.     PERRLA.  EOMI.  Patient has a contusion over his right forehead without any underlying fluctuance.  He also has a small area of maceration in  his right lower lip without any significant laceration or other intraoral trauma.  Mandible is nontender he has no tenderness over his C/T/L-spine.  He has full range of motion of his neck.  He is no effusion, tenderness, or deformity to the bilateral shoulders, elbows, wrists, knees, and ankles.  He is oriented x3.  On trial of ambulation patient is very ataxic and unable to ambulate without significant assistance.  4/5 strength throughout the  right upper and right lower extremity compared to 5/on the left upper and left lower.  Sensation intact light touch throughout all extremities.  Bilateral upper extremity tremor. ____________________________________________   LABS (all labs ordered are listed, but only abnormal results are displayed)  Labs Reviewed  BASIC METABOLIC PANEL - Abnormal; Notable for the following components:      Result Value   Potassium 3.1 (*)    Glucose, Bld 110 (*)    All other components within normal limits  CBC - Abnormal; Notable for the following components:   Hemoglobin 17.6 (*)    MCV 104.2 (*)    MCH 38.8 (*)    MCHC 37.2 (*)    All other components within normal limits  URINALYSIS, COMPLETE (UACMP) WITH MICROSCOPIC - Abnormal; Notable for the following components:   Color, Urine YELLOW (*)    APPearance HAZY (*)    Hgb urine dipstick MODERATE (*)    All other components within normal limits  AMMONIA - Abnormal; Notable for the following components:   Ammonia 38 (*)    All other components within normal limits  SARS CORONAVIRUS 2 BY RT PCR (HOSPITAL ORDER, PERFORMED IN [facility])  MAGNESIUM  HEPATIC FUNCTION PANEL  URINE DRUG SCREEN, QUALITATIVE ([facility] ONLY)  TSH  VITAMIN B12  HIV ANTIBODY (ROUTINE TESTING W REFLEX)  COMPREHENSIVE METABOLIC PANEL  CBC  CBG MONITORING, ED  TROPONIN I (HIGH SENSITIVITY)   ____________________________________________  EKG  Sinus rhythm with a ventricular of 94, normal axis, unremarkable intervals,  nonspecific ST changes in the anterior leads with no other evidence of acute ischemia or other significant underlying arrhythmia. ____________________________________________  RADIOLOGY  ED MD interpretation: Chest x-ray shows no evidence of pneumonia, thorax, cardiomegaly, edema, or significant effusion.  No intracranial bleeding, intracranial mass, or clavicular skull fracture on CT head.  CT C-spine unremarkable.  Official radiology report(s): DG Chest 2 View  Result Date: 07/27/2020 CLINICAL DATA:  Syncope EXAM: CHEST - 2 VIEW COMPARISON:  05/21/2017 FINDINGS: Bullous change again noted within the right apex. No superimposed focal pulmonary infiltrate. Lungs are are symmetrically well expanded. No pneumothorax or pleural effusion. Cardiac size within normal limits. Pulmonary vascularity normal. No acute bone abnormality. IMPRESSION: No acute cardiopulmonary disease. Electronically Signed   By: Helyn NumbersAshesh  Parikh MD   On: 07/27/2020 17:42   CT Head Wo Contrast  Result Date: 07/27/2020 CLINICAL DATA:  Fall, head injury EXAM: CT HEAD WITHOUT CONTRAST CT CERVICAL SPINE WITHOUT CONTRAST TECHNIQUE: Multidetector CT imaging of the head and cervical spine was performed following the standard protocol without intravenous contrast. Multiplanar CT image reconstructions of the cervical spine were also generated. COMPARISON:  None. FINDINGS: CT HEAD FINDINGS Brain: Normal anatomic configuration. No abnormal intra or extra-axial mass lesion or fluid collection. No abnormal mass effect or midline shift. No evidence of acute intracranial hemorrhage or infarct. Ventricular size is normal. Cerebellum unremarkable. Vascular: Unremarkable Skull: Intact Sinuses/Orbits: There is moderate mucosal thickening within the frontal sinuses bilaterally without associated air-fluid levels. Remaining paranasal sinuses are clear. Orbits are unremarkable. Other: Mastoid air cells and middle ear cavities are clear. There is mild soft  tissue swelling superficial to the right supraorbital ridge. There is mild soft tissue infiltration in keeping with edema or subcutaneous hemorrhage involving the soft tissues of the right temporal fossa. CT CERVICAL SPINE FINDINGS Alignment: Normal cervical lordosis. No listhesis of the cervical spine. Skull base and vertebrae: The craniocervical junction is unremarkable. The atlantodental interval is normal. There is  no acute cervical spine fracture. No lytic or blastic bone lesion is identified. Soft tissues and spinal canal: No prevertebral fluid or swelling. No visible canal hematoma. Disc levels: Review of the sagittal images demonstrates intervertebral disc space narrowing and endplate remodeling at C5-6 and C6-7 in keeping with changes of moderate degenerative disc disease res. Small posteriorly oriented disc osteophytes are noted at this level. Remaining intervertebral disc heights and vertebral body heights are preserved. Review of the axial images demonstrates: C1-2: Unremarkable C2-3: Unremarkable C3-4: Mild bilateral uncovertebral disease. No significant facet arthrosis. Mild resultant bilateral neural foraminal narrowing, right greater than left. No significant canal stenosis. C4-5: Unremarkable. C5-6: Severe left uncovertebral arthrosis results in moderate left neural foraminal narrowing. Minimal right uncovertebral and facet arthrosis. No significant neural foraminal narrowing on the right. No significant canal stenosis. C6-7: Moderate bilateral uncovertebral arthrosis. Mild right and mild-to-moderate left neural foraminal narrowing. No significant canal stenosis. C7-T1: Severe right facet arthrosis. No significant neural foraminal narrowing. No significant canal stenosis. Upper chest: Right apical bullous change noted. Other: None significant IMPRESSION: 1. No acute intracranial abnormality. 2. No acute cervical spine fracture. 3. Moderate degenerative disc disease at C5-6 and C6-7. 4. Moderate left  neural foraminal narrowing at C5-6 due to uncovertebral arthrosis. 5. Mild soft tissue swelling superficial to the right supraorbital ridge and the soft tissues of the right temporal fossa. Electronically Signed   By: Helyn Numbers MD   On: 07/27/2020 17:54   CT Cervical Spine Wo Contrast  Result Date: 07/27/2020 CLINICAL DATA:  Fall, head injury EXAM: CT HEAD WITHOUT CONTRAST CT CERVICAL SPINE WITHOUT CONTRAST TECHNIQUE: Multidetector CT imaging of the head and cervical spine was performed following the standard protocol without intravenous contrast. Multiplanar CT image reconstructions of the cervical spine were also generated. COMPARISON:  None. FINDINGS: CT HEAD FINDINGS Brain: Normal anatomic configuration. No abnormal intra or extra-axial mass lesion or fluid collection. No abnormal mass effect or midline shift. No evidence of acute intracranial hemorrhage or infarct. Ventricular size is normal. Cerebellum unremarkable. Vascular: Unremarkable Skull: Intact Sinuses/Orbits: There is moderate mucosal thickening within the frontal sinuses bilaterally without associated air-fluid levels. Remaining paranasal sinuses are clear. Orbits are unremarkable. Other: Mastoid air cells and middle ear cavities are clear. There is mild soft tissue swelling superficial to the right supraorbital ridge. There is mild soft tissue infiltration in keeping with edema or subcutaneous hemorrhage involving the soft tissues of the right temporal fossa. CT CERVICAL SPINE FINDINGS Alignment: Normal cervical lordosis. No listhesis of the cervical spine. Skull base and vertebrae: The craniocervical junction is unremarkable. The atlantodental interval is normal. There is no acute cervical spine fracture. No lytic or blastic bone lesion is identified. Soft tissues and spinal canal: No prevertebral fluid or swelling. No visible canal hematoma. Disc levels: Review of the sagittal images demonstrates intervertebral disc space narrowing and  endplate remodeling at C5-6 and C6-7 in keeping with changes of moderate degenerative disc disease res. Small posteriorly oriented disc osteophytes are noted at this level. Remaining intervertebral disc heights and vertebral body heights are preserved. Review of the axial images demonstrates: C1-2: Unremarkable C2-3: Unremarkable C3-4: Mild bilateral uncovertebral disease. No significant facet arthrosis. Mild resultant bilateral neural foraminal narrowing, right greater than left. No significant canal stenosis. C4-5: Unremarkable. C5-6: Severe left uncovertebral arthrosis results in moderate left neural foraminal narrowing. Minimal right uncovertebral and facet arthrosis. No significant neural foraminal narrowing on the right. No significant canal stenosis. C6-7: Moderate bilateral uncovertebral arthrosis. Mild right and  mild-to-moderate left neural foraminal narrowing. No significant canal stenosis. C7-T1: Severe right facet arthrosis. No significant neural foraminal narrowing. No significant canal stenosis. Upper chest: Right apical bullous change noted. Other: None significant IMPRESSION: 1. No acute intracranial abnormality. 2. No acute cervical spine fracture. 3. Moderate degenerative disc disease at C5-6 and C6-7. 4. Moderate left neural foraminal narrowing at C5-6 due to uncovertebral arthrosis. 5. Mild soft tissue swelling superficial to the right supraorbital ridge and the soft tissues of the right temporal fossa. Electronically Signed   By: Helyn Numbers MD   On: 07/27/2020 17:54   MR BRAIN WO CONTRAST  Result Date: 07/27/2020 CLINICAL DATA:  Neuro deficit, acute, stroke suspected. Additional history provided: Patient reports right-sided weakness, tunnel vision, stumbling, muscle weakness for 2 months. EXAM: MRI HEAD WITHOUT CONTRAST TECHNIQUE: Multiplanar, multiecho pulse sequences of the brain and surrounding structures were obtained without intravenous contrast. COMPARISON:  Head CT performed  earlier the same day 07/27/2020. FINDINGS: Brain: The examination is intermittently motion degraded. Most notably, there is moderate/severe motion degradation of the sagittal T1 weighted sequence, moderate motion degradation of the axial T2/FLAIR sequence and mild-to-moderate motion degradation of the axial T1 weighted sequence. Mild generalized parenchymal atrophy. 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 Minimal background scattered T2/FLAIR hyperintensity within the cerebral white matter is nonspecific, but consistent with chronic small vessel ischemic disease. Tiny chronic infarct within the right cerebellar hemisphere. There is no acute infarct. No evidence of intracranial mass. No chronic intracranial blood products. No extra-axial fluid collection. No midline shift. Vascular: Expected proximal arterial flow voids. Skull and upper cervical spine: No focal marrow lesion is identified within the limitations of motion degradation. Sinuses/Orbits: Visualized orbits show no acute finding. Mild diffuse paranasal sinus mucosal thickening. Trace fluid within right mastoid air cells. IMPRESSION: Intermittently motion degraded examination as described. No evidence of acute intracranial abnormality, including acute infarction. 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 Background mild generalized parenchymal atrophy and cerebral white matter chronic small vessel ischemic disease. Mild paranasal sinus mucosal thickening. Trace right mastoid effusion. Electronically Signed   By: Jackey Loge DO   On: 07/27/2020 20:05   DG Pelvis Portable  Result Date: 07/27/2020 CLINICAL DATA:  Pelvic pain, fall EXAM: PORTABLE PELVIS 1-2 VIEWS COMPARISON:  CT 05/21/2017 FINDINGS: There is no evidence of pelvic fracture or diastasis. No pelvic bone lesions are seen. IMPRESSION: Negative. Electronically Signed   By: Jasmine Pang M.D.   On: 07/27/2020 21:06    ____________________________________________   PROCEDURES  Procedure(s) performed (including Critical Care):  Procedures   ____________________________________________   INITIAL IMPRESSION / ASSESSMENT AND PLAN / ED COURSE        Patient presents with [place] to history exam for assessment after a syncopal episode and fall as described above as well as abnormal gait.  Patient does endorse alcohol use including last night.  He is hypertensive otherwise stable vital signs on arrival.  Exam as above remarkable for tremor with an ataxic gait and right hemibody weakness.  Patient is otherwise oriented.  Differential for patient's syncope includes dehydration, intoxication, metabolic derangement, arrhythmia, ACS, PE, CVA, and mechanical causes i.e. slipping or tripping.  Other than contusion and slight maceration of the right lower lip no other significant injuries noted on exam or work-up.  Low suspicion for ACS given patient denies any chest pain and has nonelevated troponin pain greater than 6 hours after the onset of this  episode with otherwise relatively reassuring EKG.  There is no other evidence of significant arrhythmia.  MR brain shows no evidence of CVA but does show chronic microvascular disease.  Ammonia is noted be slightly elevated but otherwise there are no significant electrolyte derangements aside from mildly decreased potassium.  UA shows some hemoglobin but does not appear infected.  No other obvious source of infection on exam.  Low suspicion for PE as patient denies any shortness of breath and has no evidence of respiratory distress on exam with an unremarkable respiratory rate and SPO2 saturation of 99% on room air.  Patient was given IV fluids and given concern for possible alcohol withdrawal given tremors and hypertension on arrival with elevated MCV on CBC he was placed on CIWA protocol with improvement in his blood pressure and resolution  of his tremors on reassessment.  However on repeat trial of ambulation patient is still ataxic and unable to ambulate without significant assistance.  High-dose thiamine ordered to treat for possible Warnicke's.  Neurology service consulted for any additional recommendations with regards to further evaluation and treatment.  Improved after discussion with teleneurologist alligator neurology recommends MR C-spine and a.m. in person consult.  MR C-spine ordered.  Will plan to admit to hospitalist service for further evaluation and management. ____________________________________________   FINAL CLINICAL IMPRESSION(S) / ED DIAGNOSES  Final diagnoses:  Hypokalemia  Ataxia  Syncope, unspecified syncope type  Macrocytosis  Hematuria, unspecified type  Injury of lip, initial encounter  Contusion of forehead, initial encounter  Hypertension, unspecified type  Increased ammonia level  ETOH abuse    Medications  LORazepam (ATIVAN) injection 0-4 mg (1 mg Intravenous Given 07/27/20 1732)    Or  LORazepam (ATIVAN) tablet 0-4 mg ( Oral See Alternative 07/27/20 1732)  LORazepam (ATIVAN) injection 0-4 mg (has no administration in time range)    Or  LORazepam (ATIVAN) tablet 0-4 mg (has no administration in time range)  thiamine tablet 100 mg ( Oral See Alternative 07/27/20 1733)    Or  thiamine (B-1) injection 100 mg (100 mg Intravenous Given 07/27/20 1733)  thiamine (B-1) injection 500 mg (500 mg Intravenous Given 07/27/20 2109)  LORazepam (ATIVAN) tablet 1-4 mg (has no administration in time range)    Or  LORazepam (ATIVAN) injection 1-4 mg (has no administration in time range)  folic acid (FOLVITE) tablet 1 mg (1 mg Oral Not Given 07/27/20 2131)  multivitamin with minerals tablet 1 tablet (1 tablet Oral Given 07/27/20 2135)  enoxaparin (LOVENOX) injection 40 mg (40 mg Subcutaneous Given 07/27/20 2135)  sodium chloride flush (NS) 0.9 % injection 3 mL (3 mLs Intravenous Not Given 07/27/20 2131)    acetaminophen (TYLENOL) tablet 650 mg (has no administration in time range)    Or  acetaminophen (TYLENOL) suppository 650 mg (has no administration in time range)  methocarbamol (ROBAXIN) 500 mg in dextrose 5 % 50 mL IVPB (has no administration in time range)  traZODone (DESYREL) tablet 50 mg (has no administration in time range)  polyethylene glycol (MIRALAX / GLYCOLAX) packet 17 g (has no administration in time range)  ondansetron (ZOFRAN) tablet 4 mg (has no administration in time range)    Or  ondansetron (ZOFRAN) injection 4 mg (has no administration in time range)  albuterol (PROVENTIL) (2.5 MG/3ML) 0.083% nebulizer solution 2.5 mg (has no administration in time range)  ipratropium (ATROVENT) nebulizer solution 0.5 mg (has no administration in time range)  lactated ringers bolus 1,000 mL (0 mLs Intravenous Stopped 07/27/20 2102)  potassium  chloride SA (KLOR-CON) CR tablet 40 mEq (40 mEq Oral Given 07/27/20 1731)  folic acid (FOLVITE) tablet 1 mg (1 mg Oral Given 07/27/20 2108)     ED Discharge Orders    None       Note:  This document was prepared using Dragon voice recognition software and may include unintentional dictation errors.   Gilles Chiquito, MD 07/27/20 2141

## 2020-07-27 NOTE — ED Triage Notes (Signed)
Pt in via EMS from home with c/o right sided weakness, tunnel vision, stumbling ad muscle weakness for 2 months. Pt fell prior to calling EMS and hit his lip and has an abrasion to his forehead. Pt is non complaint with BP meds. Pt has been drinking whiskey from 10pm-3am today. HR 95, 165/111, O2, 97%

## 2020-07-27 NOTE — ED Notes (Signed)
Pt in CT.

## 2020-07-27 NOTE — ED Notes (Signed)
Pt reports falling this morning and hitting head from dizziness, denies LOC. Small lacerations noted to both arms and head.  Pt appears jittery, tremor in right arm. Recently drank 3 glasses of alcohol, poor historian, unsure of last drink last night or this morning.   Hx of stroke, right side deficit.  Denies CP or SHOB "no more than usual from COPD"

## 2020-07-27 NOTE — ED Notes (Signed)
Pt ambulatory in treatment room, very unsteady gait. Reports this is unusual, started having difficulty this morning

## 2020-07-27 NOTE — H&P (Signed)
[facility] History and Physical  Jeff Patton OQH:[id number] DOB: [birth year] DOA: 07/27/2020  Referring physician: Dr. Katrinka Blazing PCP: Patient, No Pcp Per   Chief Complaint: Difficulty walking  HPI: Jeff Patton is a 54 y.o. male history of provoked PE, alcohol use disorder, who presents after syncopal event.  Patient reports that earlier this morning he was at home by himself walking down the hall back to the kitchen from his bathroom when he suddenly felt like he developed tunnel vision and unsteadiness.  He subsequently fell and hit his head on the side of the counter and was bleeding from his forehead and his lip.  He is unsure if he lost consciousness completely or not.  He does not recall feeling nauseous or sweaty after the event, but his daughter who arrived shortly after reports that he did seem a little confused.  He reports he had a similar episode about a month prior but it was not quite as intense.  He states that he lives alone.  He does endorse using alcohol but states that it is only on nights when he does not have to work the next day.  He reports that he usually drinks about 3 large glasses of liquor when he does drink.  On further questioning he has also had some trouble with balance, states that he cannot close his eyes without feeling like he is going to fall down.  He notices this in the shower when he is trying to wash shampoo out of his hair he has to lean against the shower wall to keep from falling down.   Review of chart shows the patient was admitted June 2018 for And pulmonary embolism.  Review of chart shows that lifelong anticoagulation was not planned, patient states he is no longer on anticoagulation.  In the ED initial vital signs notable only for hypertension.  Physical exam was notable for ataxia and positive Romberg.  Lab work-up notable for CMP with mild hypokalemia of 3.1, CBC with mild macrocytosis and thrombocytosis with MCV 104 and hemoglobin 17,  troponin normal, TSH normal.  Covid test was negative.  Due to report of head trauma he underwent CT head and C-spine which showed degenerative changes but no acute findings.  Chest x-ray was normal.  EKG was unremarkable and unchanged compared to prior.  MRI brain did not show any acute findings.  Neurology was consulted by ED provider and recommended obtaining MR C-spine as well.  Due to concern for possible alcohol withdrawal he was treated with Ativan, and due to concern for Warnicke's he was also given high-dose thiamine.  He was admitted for further work-up and management for syncopal episode and ataxia.   Review of Systems:  Pertinent positives and negative per HPI, all others reviewed and negative   Past Medical History:  Diagnosis Date  . Hypertension    Past Surgical History:  Procedure Laterality Date  . NO PAST SURGERIES     Social History:  reports that he has been smoking cigarettes. He has been smoking about 1.00 pack per day. He has never used smokeless tobacco. He reports that he does not drink alcohol and does not use drugs.  No Known Allergies  Family History  Problem Relation Age of Onset  . Hypertension Other     Prior to Admission medications   Medication Sig Start Date End Date Taking? Authorizing Provider  albuterol (PROVENTIL HFA;VENTOLIN HFA) 108 (90 Base) MCG/ACT inhaler Inhale 2 puffs into the lungs every 6 (six)  hours as needed for wheezing or shortness of breath. 05/26/17   Katha Hamming, MD  apixaban (ELIQUIS) 5 MG TABS tablet Take 1 tablet (5 mg total) by mouth 2 (two) times daily. 05/30/17   Katha Hamming, MD  Ca Carbonate-Mag Hydroxide (ROLAIDS) 550-110 MG CHEW Chew 1 Dose by mouth as needed.    [provider]  cyclobenzaprine (FLEXERIL) 5 MG tablet Take 1 tablet (5 mg total) by mouth 3 (three) times daily as needed for muscle spasms. 05/26/17   Katha Hamming, MD  guaiFENesin-dextromethorphan (ROBITUSSIN DM) 100-10 MG/5ML  syrup Take 5 mLs by mouth every 4 (four) hours as needed for cough. 05/26/17   Katha Hamming, MD  oxyCODONE (ROXICODONE) 5 MG immediate release tablet Take 1 tablet (5 mg total) by mouth every 4 (four) hours as needed for moderate pain. 05/26/17   Katha Hamming, MD  oxymetazoline (AFRIN) 0.05 % nasal spray Place 1 spray into both nostrils 2 (two) times daily as needed for congestion.    [provider]   Physical Exam: Vitals:   07/27/20 1824 07/27/20 2000 07/27/20 2130 07/27/20 2140  BP: (!) 145/94 129/83 (!) 187/129 (!) 187/129  Pulse: 66 72 89 83  Resp:  14 16   Temp:      TempSrc:      SpO2:  96% 100%   Weight:      Height:        Wt Readings from Last 3 Encounters:  07/27/20 90.7 kg  05/21/17 83.9 kg  01/04/17 81.6 kg    General:  Appears calm and comfortable Eyes: PERRL, normal lids, irises & conjunctiva ENT: grossly normal hearing and tongue, small abrasion present on R lower lip Neck: no LAD, masses or thyromegaly Cardiovascular: RRR, no m/r/g. No LE edema. Telemetry: SR, no arrhythmias  Respiratory: CTA bilaterally, no w/r/r. Normal respiratory effort. Abdomen: soft, ntnd Skin: no rash or induration seen on limited exam Musculoskeletal: grossly normal tone BUE/BLE Psychiatric: grossly normal mood and affect, speech fluent and appropriate Neurologic: grossly non-focal. CN intact. Strength 5/5 and sensation intact to light touch in bilateral UE/LE. Slow and poorly coordinated finger to nose testing bilaterally. Heel-shin testing slow and discoordinated, R>L. Positive Romberg.           Labs on Admission:  Basic Metabolic Panel: Recent Labs  Lab 07/27/20 0739 07/27/20 1639  NA 136  --   K 3.1*  --   CL 101  --   CO2 24  --   GLUCOSE 110*  --   BUN 10  --   CREATININE 0.87  --   CALCIUM 9.3  --   MG  --  2.0   Liver Function Tests: Recent Labs  Lab 07/27/20 1639  AST 33  ALT 37  ALKPHOS 66  BILITOT 0.9  PROT 8.1  ALBUMIN 4.1    No results for input(s): LIPASE, AMYLASE in the last 168 hours. Recent Labs  Lab 07/27/20 1653  AMMONIA 38*   CBC: Recent Labs  Lab 07/27/20 0739  WBC 10.0  HGB 17.6*  HCT 47.3  MCV 104.2*  PLT 231   Cardiac Enzymes: No results for input(s): CKTOTAL, CKMB, CKMBINDEX, TROPONINI in the last 168 hours.  BNP (last 3 results) No results for input(s): BNP in the last 8760 hours.  ProBNP (last 3 results) No results for input(s): PROBNP in the last 8760 hours.  CBG: No results for input(s): GLUCAP in the last 168 hours.  Radiological Exams on Admission: DG Chest 2 View  Result Date: 07/27/2020 CLINICAL DATA:  Syncope EXAM: CHEST - 2 VIEW COMPARISON:  05/21/2017 FINDINGS: Bullous change again noted within the right apex. No superimposed focal pulmonary infiltrate. Lungs are are symmetrically well expanded. No pneumothorax or pleural effusion. Cardiac size within normal limits. Pulmonary vascularity normal. No acute bone abnormality. IMPRESSION: No acute cardiopulmonary disease. Electronically Signed   By: Helyn Numbers MD   On: 07/27/2020 17:42   CT Head Wo Contrast  Result Date: 07/27/2020 CLINICAL DATA:  Fall, head injury EXAM: CT HEAD WITHOUT CONTRAST CT CERVICAL SPINE WITHOUT CONTRAST TECHNIQUE: Multidetector CT imaging of the head and cervical spine was performed following the standard protocol without intravenous contrast. Multiplanar CT image reconstructions of the cervical spine were also generated. COMPARISON:  None. FINDINGS: CT HEAD FINDINGS Brain: Normal anatomic configuration. No abnormal intra or extra-axial mass lesion or fluid collection. No abnormal mass effect or midline shift. No evidence of acute intracranial hemorrhage or infarct. Ventricular size is normal. Cerebellum unremarkable. Vascular: Unremarkable Skull: Intact Sinuses/Orbits: There is moderate mucosal thickening within the frontal sinuses bilaterally without associated air-fluid levels. Remaining paranasal  sinuses are clear. Orbits are unremarkable. Other: Mastoid air cells and middle ear cavities are clear. There is mild soft tissue swelling superficial to the right supraorbital ridge. There is mild soft tissue infiltration in keeping with edema or subcutaneous hemorrhage involving the soft tissues of the right temporal fossa. CT CERVICAL SPINE FINDINGS Alignment: Normal cervical lordosis. No listhesis of the cervical spine. Skull base and vertebrae: The craniocervical junction is unremarkable. The atlantodental interval is normal. There is no acute cervical spine fracture. No lytic or blastic bone lesion is identified. Soft tissues and spinal canal: No prevertebral fluid or swelling. No visible canal hematoma. Disc levels: Review of the sagittal images demonstrates intervertebral disc space narrowing and endplate remodeling at C5-6 and C6-7 in keeping with changes of moderate degenerative disc disease res. Small posteriorly oriented disc osteophytes are noted at this level. Remaining intervertebral disc heights and vertebral body heights are preserved. Review of the axial images demonstrates: C1-2: Unremarkable C2-3: Unremarkable C3-4: Mild bilateral uncovertebral disease. No significant facet arthrosis. Mild resultant bilateral neural foraminal narrowing, right greater than left. No significant canal stenosis. C4-5: Unremarkable. C5-6: Severe left uncovertebral arthrosis results in moderate left neural foraminal narrowing. Minimal right uncovertebral and facet arthrosis. No significant neural foraminal narrowing on the right. No significant canal stenosis. C6-7: Moderate bilateral uncovertebral arthrosis. Mild right and mild-to-moderate left neural foraminal narrowing. No significant canal stenosis. C7-T1: Severe right facet arthrosis. No significant neural foraminal narrowing. No significant canal stenosis. Upper chest: Right apical bullous change noted. Other: None significant IMPRESSION: 1. No acute intracranial  abnormality. 2. No acute cervical spine fracture. 3. Moderate degenerative disc disease at C5-6 and C6-7. 4. Moderate left neural foraminal narrowing at C5-6 due to uncovertebral arthrosis. 5. Mild soft tissue swelling superficial to the right supraorbital ridge and the soft tissues of the right temporal fossa. Electronically Signed   By: Helyn Numbers MD   On: 07/27/2020 17:54   CT Cervical Spine Wo Contrast  Result Date: 07/27/2020 CLINICAL DATA:  Fall, head injury EXAM: CT HEAD WITHOUT CONTRAST CT CERVICAL SPINE WITHOUT CONTRAST TECHNIQUE: Multidetector CT imaging of the head and cervical spine was performed following the standard protocol without intravenous contrast. Multiplanar CT image reconstructions of the cervical spine were also generated. COMPARISON:  None. FINDINGS: CT HEAD FINDINGS Brain: Normal anatomic configuration. No abnormal intra or extra-axial mass lesion or fluid collection. No  abnormal mass effect or midline shift. No evidence of acute intracranial hemorrhage or infarct. Ventricular size is normal. Cerebellum unremarkable. Vascular: Unremarkable Skull: Intact Sinuses/Orbits: There is moderate mucosal thickening within the frontal sinuses bilaterally without associated air-fluid levels. Remaining paranasal sinuses are clear. Orbits are unremarkable. Other: Mastoid air cells and middle ear cavities are clear. There is mild soft tissue swelling superficial to the right supraorbital ridge. There is mild soft tissue infiltration in keeping with edema or subcutaneous hemorrhage involving the soft tissues of the right temporal fossa. CT CERVICAL SPINE FINDINGS Alignment: Normal cervical lordosis. No listhesis of the cervical spine. Skull base and vertebrae: The craniocervical junction is unremarkable. The atlantodental interval is normal. There is no acute cervical spine fracture. No lytic or blastic bone lesion is identified. Soft tissues and spinal canal: No prevertebral fluid or swelling. No  visible canal hematoma. Disc levels: Review of the sagittal images demonstrates intervertebral disc space narrowing and endplate remodeling at C5-6 and C6-7 in keeping with changes of moderate degenerative disc disease res. Small posteriorly oriented disc osteophytes are noted at this level. Remaining intervertebral disc heights and vertebral body heights are preserved. Review of the axial images demonstrates: C1-2: Unremarkable C2-3: Unremarkable C3-4: Mild bilateral uncovertebral disease. No significant facet arthrosis. Mild resultant bilateral neural foraminal narrowing, right greater than left. No significant canal stenosis. C4-5: Unremarkable. C5-6: Severe left uncovertebral arthrosis results in moderate left neural foraminal narrowing. Minimal right uncovertebral and facet arthrosis. No significant neural foraminal narrowing on the right. No significant canal stenosis. C6-7: Moderate bilateral uncovertebral arthrosis. Mild right and mild-to-moderate left neural foraminal narrowing. No significant canal stenosis. C7-T1: Severe right facet arthrosis. No significant neural foraminal narrowing. No significant canal stenosis. Upper chest: Right apical bullous change noted. Other: None significant IMPRESSION: 1. No acute intracranial abnormality. 2. No acute cervical spine fracture. 3. Moderate degenerative disc disease at C5-6 and C6-7. 4. Moderate left neural foraminal narrowing at C5-6 due to uncovertebral arthrosis. 5. Mild soft tissue swelling superficial to the right supraorbital ridge and the soft tissues of the right temporal fossa. Electronically Signed   By: Helyn Numbers MD   On: 07/27/2020 17:54   MR BRAIN WO CONTRAST  Result Date: 07/27/2020 CLINICAL DATA:  Neuro deficit, acute, stroke suspected. Additional history provided: Patient reports right-sided weakness, tunnel vision, stumbling, muscle weakness for 2 months. EXAM: MRI HEAD WITHOUT CONTRAST TECHNIQUE: Multiplanar, multiecho pulse sequences  of the brain and surrounding structures were obtained without intravenous contrast. COMPARISON:  Head CT performed earlier the same day 07/27/2020. FINDINGS: Brain: The examination is intermittently motion degraded. Most notably, there is moderate/severe motion degradation of the sagittal T1 weighted sequence, moderate motion degradation of the axial T2/FLAIR sequence and mild-to-moderate motion degradation of the axial T1 weighted sequence. Mild generalized parenchymal atrophy. 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 Minimal background scattered T2/FLAIR hyperintensity within the cerebral white matter is nonspecific, but consistent with chronic small vessel ischemic disease. Tiny chronic infarct within the right cerebellar hemisphere. There is no acute infarct. No evidence of intracranial mass. No chronic intracranial blood products. No extra-axial fluid collection. No midline shift. Vascular: Expected proximal arterial flow voids. Skull and upper cervical spine: No focal marrow lesion is identified within the limitations of motion degradation. Sinuses/Orbits: Visualized orbits show no acute finding. Mild diffuse paranasal sinus mucosal thickening. Trace fluid within right mastoid air cells. IMPRESSION: Intermittently motion degraded examination as described. No evidence of acute intracranial abnormality, including acute infarction. 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 Background mild generalized parenchymal atrophy and cerebral white matter chronic small vessel ischemic disease. Mild paranasal sinus mucosal thickening. Trace right mastoid effusion. Electronically Signed   By: Jackey LogeKyle  Golden DO   On: 07/27/2020 20:05   MR Cervical Spine Wo Contrast  Result Date: 07/27/2020 CLINICAL DATA:  Ataxia, cervical spine trauma EXAM: MRI CERVICAL SPINE WITHOUT CONTRAST TECHNIQUE: Multiplanar, multisequence  MR imaging of the cervical spine was performed. No intravenous contrast was administered. COMPARISON:  CT same day FINDINGS: Alignment: Physiologic Vertebrae: The vertebral body heights are well maintained. Increased marrow signal seen at the endplates of C5-C6 and C6-C7. Cord: There is question of minimally increased STIR signal seen within the central cord at C5-C6 and C6-C7. No cord expansion however is noted. Posterior Fossa, vertebral arteries, paraspinal tissues: The visualized portion of the posterior fossa is unremarkable. Normal flow voids seen within the vertebral arteries. The paraspinal soft tissues are unremarkable. Disc levels: C1-C2: Atlanto-axial junction is normal, without canal narrowing C2-C3: No significant spinal canal or neural foraminal narrowing C3-C4: There is a disc osteophyte complex and uncovertebral osteophytes which causes severe neural foraminal narrowing. C4-C5: There is a disc osteophyte complex and uncovertebral osteophytes which causes moderate bilateral neural foraminal narrowing. C5-C6: There is a disc osteophyte complex and a left lateral recess disc protrusion which contacts and impinges the left C6 nerve root. There is severe left and moderate right neural foraminal narrowing. Mild central canal stenosis is noted. C6-C7: There is a disc osteophyte complex and uncovertebral osteophytes with a right far lateral disc protrusion which contacts and impinges the right C7 nerve root. There is severe bilateral neural foraminal narrowing and mild to moderate central canal stenosis. C7-T1: No significant spinal canal or neural foraminal narrowing IMPRESSION: 1. 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 2. Cervical spine spondylosis most notable C5-C6 with a left lateral recess disc protrusion contacting and impinging the left C6 nerve root with severe left neural foraminal narrowing and mild central canal stenosis. 3. Also at C6-C7 with a right far  lateral disc protrusion contacting and pinching the right C7 nerve root with severe bilateral neural foraminal narrowing and mild to moderate central canal stenosis. Electronically Signed   By: Jonna ClarkBindu  Avutu M.D.   On: 07/27/2020 23:00   DG Pelvis Portable  Result Date: 07/27/2020 CLINICAL DATA:  Pelvic pain, fall EXAM: PORTABLE PELVIS 1-2 VIEWS COMPARISON:  CT 05/21/2017 FINDINGS: There is no evidence of pelvic fracture or diastasis. No pelvic bone lesions are seen. IMPRESSION: Negative. Electronically Signed   By: Jasmine PangKim  Fujinaga M.D.   On: 07/27/2020 21:06    EKG: Independently reviewed.  Sinus, no ischemic changes.  No change compared to prior.  Assessment/Plan Active Problems:   Ataxia  #Syncope Patient presenting after second episode of syncope resulting in trauma.  History not suggestive of vasovagal etiology though this remains the most likely cause.  Lack of prodromal symptoms raises the possibility of cardiac etiology, will monitor on telemetry but may need prolonged outpatient monitoring as well.  Some confusion reported afterward but overall low suspicion for seizure.  Does have a history of PE and is no longer and anticoagulation. -Telemetry -D-dimer, CTPA if elevated -TTE -Orthostatic vitals  #Ataxia Patient with markedly positive Romberg on exam and also collateral from daughter noting that he has been unsteady on his feet.  Reports history of binge drinking for many years.  Neurology  consulted in the ED, per their discussion with ED provider may be secondary to cerebellar damage from prolonged alcohol use.  Warnicke's less likely given does not appear to have any memory deficits nor nystagmus on exam.  Patient's B12 also low on admission labs at 175, but may reflect malnutrition in setting of alcohol use disorder rather than the cause of his ataxia. -Follow-up results of cervical MRI -Formally consult neurology in a.m. -PT and OT consults -Replete B12  #EtOH Use disorder Some  concern for alcohol withdrawal from ED provider, on my exam patient calm though has received 2 doses of Ativan.  No prior documented episodes of alcohol withdrawal. -Monitor and treat per [facility]WA protocol -Multivitamin, folate, thiamine  Code Status: Full Code, confirmed DVT Prophylaxis: Lovenox Family Communication: Daughter updated at bedside Disposition Plan: Inpatient  Time spent: 88 min  Venora Maples MD/[facility]

## 2020-07-27 NOTE — ED Notes (Signed)
Pt noted to have intermittent tremors to right arm again. Pt alert and oriented, clear speech. Denies N/V, vision impairment. NAD noted

## 2020-07-28 ENCOUNTER — Inpatient Hospital Stay
Admit: 2020-07-28 | Discharge: 2020-07-28 | Disposition: A | Discharge status: Home / Self Care | Payer: Self-pay | Attending: Family Medicine | Admitting: Family Medicine

## 2020-07-28 DIAGNOSIS — R55 Syncope and collapse: Secondary | ICD-10-CM

## 2020-07-28 DIAGNOSIS — D519 Vitamin B12 deficiency anemia, unspecified: Secondary | ICD-10-CM

## 2020-07-28 LAB — CBC
HCT: 43.5 % (ref 39.0–52.0)
Hemoglobin: 15.7 g/dL (ref 13.0–17.0)
MCH: 38.3 pg — ABNORMAL HIGH (ref 26.0–34.0)
MCHC: 36.1 g/dL — ABNORMAL HIGH (ref 30.0–36.0)
MCV: 106.1 fL — ABNORMAL HIGH (ref 80.0–100.0)
Platelets: 191 10*3/uL (ref 150–400)
RBC: 4.1 MIL/uL — ABNORMAL LOW (ref 4.22–5.81)
RDW: 12 % (ref 11.5–15.5)
WBC: 8.1 10*3/uL (ref 4.0–10.5)
nRBC: 0 % (ref 0.0–0.2)

## 2020-07-28 LAB — ECHOCARDIOGRAM COMPLETE
AR max vel: 3.88 cm2
AV Area VTI: 4.03 cm2
AV Area mean vel: 4.06 cm2
AV Mean grad: 2 mmHg
AV Peak grad: 3.8 mmHg
Ao pk vel: 0.97 m/s
Area-P 1/2: 4.89 cm2
Height: 70 in
S' Lateral: 2.69 cm
Weight: 3200 oz

## 2020-07-28 LAB — FIBRIN DERIVATIVES D-DIMER (ARMC ONLY): Fibrin derivatives D-dimer (ARMC): 473.63 ng/mL (FEU) (ref 0.00–499.00)

## 2020-07-28 LAB — COMPREHENSIVE METABOLIC PANEL
ALT: 29 U/L (ref 0–44)
AST: 29 U/L (ref 15–41)
Albumin: 3.3 g/dL — ABNORMAL LOW (ref 3.5–5.0)
Alkaline Phosphatase: 51 U/L (ref 38–126)
Anion gap: 4 — ABNORMAL LOW (ref 5–15)
BUN: 10 mg/dL (ref 6–20)
CO2: 28 mmol/L (ref 22–32)
Calcium: 8.6 mg/dL — ABNORMAL LOW (ref 8.9–10.3)
Chloride: 103 mmol/L (ref 98–111)
Creatinine, Ser: 0.83 mg/dL (ref 0.61–1.24)
GFR calc Af Amer: >60 mL/min (ref >60)
GFR calc non Af Amer: >60 mL/min (ref >60)
Glucose, Bld: 96 mg/dL (ref 70–99)
Potassium: 3.9 mmol/L (ref 3.5–5.1)
Sodium: 135 mmol/L (ref 135–145)
Total Bilirubin: 1.3 mg/dL — ABNORMAL HIGH (ref 0.3–1.2)
Total Protein: 6.3 g/dL — ABNORMAL LOW (ref 6.5–8.1)

## 2020-07-28 LAB — HIV ANTIBODY (ROUTINE TESTING W REFLEX): HIV Screen 4th Generation wRfx: NONREACTIVE

## 2020-07-28 LAB — ETHANOL: Alcohol, Ethyl (B): <10 mg/dL (ref <10)

## 2020-07-28 MED ORDER — PERFLUTREN LIPID MICROSPHERE
1.0000 mL | INTRAVENOUS | Status: AC | PRN
  Administered 2020-07-28: 2 mL via INTRAVENOUS
  Filled 2020-07-28: qty 10

## 2020-07-28 MED ORDER — HYDRALAZINE HCL 20 MG/ML IJ SOLN
20.0000 mg | Freq: Four times a day (QID) | INTRAMUSCULAR | Status: DC | PRN
  Administered 2020-07-28: 20 mg via INTRAVENOUS
  Filled 2020-07-28: qty 1

## 2020-07-28 MED ORDER — AMLODIPINE BESYLATE 10 MG PO TABS
10.0000 mg | ORAL_TABLET | Freq: Every day | ORAL | Status: DC
  Administered 2020-07-28 – 2020-08-02 (×6): 10 mg via ORAL
  Filled 2020-07-28: qty 2
  Filled 2020-07-28 (×5): qty 1

## 2020-07-28 MED ORDER — VITAMIN B-12 1000 MCG PO TABS
1000.0000 ug | ORAL_TABLET | Freq: Every day | ORAL | Status: DC
  Administered 2020-07-28 – 2020-07-29 (×2): 1000 ug via ORAL
  Filled 2020-07-28 (×2): qty 1

## 2020-07-28 NOTE — Progress Notes (Signed)
*  PRELIMINARY RESULTS* Echocardiogram 2D Echocardiogram has been performed.  Joanette Gula Triston Skare 07/28/2020, 12:34 PM

## 2020-07-28 NOTE — Progress Notes (Addendum)
PROGRESS NOTE    Jeff Patton  ZES:[id number] DOB: 01/09/1966 DOA: 07/27/2020 PCP: Patient, No Pcp Per    Assessment & Plan:   Active Problems:   Ataxia  Syncope: etiology unclear, ddx vasovagal vs cardiogenic. Orthostatic vitals ordered but not done yet. Continue on tele. CT head/ C-spine shows no acute intracranial findings, no c-spine fracture & mild soft tissue swelling superficial to the right of the supraorbital ridge & the soft tissues of the right temporal fossa  Ataxia: etiology unclear but hx of alcohol abuse. No ethanol level done on admission, so ethanol level today is <10. PT/OT consulted  Vitamin B12 deficiency: will continue w/ B12 supplements  Alcohol abuse: continue on CIWA protocol. Continue on folate, thiamine & multivitamin. Alcohol cessation counseling   Elevated BP: unknown hx of HTN or not. Will start amlodipine. IV hydralazine. Will likely have to be d/c w/ anti-HTN meds    DVT prophylaxis: lovenox Code Status: full  Family Communication:called pt's daughter, Jeanie Cooks, but no answer and unable to leave a voicemail  Disposition Plan: depends on PT/OT recs    Consultants:      Procedures:    Antimicrobials:   Subjective: Pt c/o eye pain   Objective: Vitals:   07/28/20 0430 07/28/20 0500 07/28/20 0651 07/28/20 0653  BP: (!) 141/92 (!) 136/98 (!) 183/108 (!) 183/108  Pulse: 71 71 67 69  Resp: 17 (!) 22 19   Temp:      TempSrc:      SpO2: 98% 100% 100%   Weight:      Height:        Intake/Output Summary (Last 24 hours) at 07/28/2020 0809 Last data filed at 07/27/2020 2102 Gross per 24 hour  Intake 1000 ml  Output --  Net 1000 ml   Filed Weights   07/27/20 0736  Weight: 90.7 kg    Examination:  General exam: Appears calm and comfortable  Respiratory system: Clear to auscultation. Respiratory effort normal. Cardiovascular system: S1 & S2 +. No  rubs, gallops or clicks.  Gastrointestinal system: Abdomen is nondistended, soft  and nontender. Hypoactive bowel sounds heard. Central nervous system: Alert and oriented. Moves all 4 extremities  Psychiatry: Judgement and insight appear normal. Mood & affect appropriate.     Data Reviewed: I have personally reviewed following labs and imaging studies  CBC: Recent Labs  Lab 07/27/20 0739 07/28/20 0346  WBC 10.0 8.1  HGB 17.6* 15.7  HCT 47.3 43.5  MCV 104.2* 106.1*  PLT 231 191   Basic Metabolic Panel: Recent Labs  Lab 07/27/20 0739 07/27/20 1639 07/28/20 0346  NA 136  --  135  K 3.1*  --  3.9  CL 101  --  103  CO2 24  --  28  GLUCOSE 110*  --  96  BUN 10  --  10  CREATININE 0.87  --  0.83  CALCIUM 9.3  --  8.6*  MG  --  2.0  --    GFR: Estimated Creatinine Clearance: 118 mL/min (by C-G formula based on SCr of 0.83 mg/dL). Liver Function Tests: Recent Labs  Lab 07/27/20 1639 07/28/20 0346  AST 33 29  ALT 37 29  ALKPHOS 66 51  BILITOT 0.9 1.3*  PROT 8.1 6.3*  ALBUMIN 4.1 3.3*   No results for input(s): LIPASE, AMYLASE in the last 168 hours. Recent Labs  Lab 07/27/20 1653  AMMONIA 38*   Coagulation Profile: No results for input(s): INR, PROTIME in the last 168 hours. Cardiac  Enzymes: No results for input(s): CKTOTAL, CKMB, CKMBINDEX, TROPONINI in the last 168 hours. BNP (last 3 results) No results for input(s): PROBNP in the last 8760 hours. HbA1C: No results for input(s): HGBA1C in the last 72 hours. CBG: No results for input(s): GLUCAP in the last 168 hours. Lipid Profile: No results for input(s): CHOL, HDL, LDLCALC, TRIG, CHOLHDL, LDLDIRECT in the last 72 hours. Thyroid Function Tests: Recent Labs    07/27/20 1653  TSH 1.400   Anemia Panel: Recent Labs    07/27/20 0739  VITAMINB12 175*   Sepsis Labs: No results for input(s): PROCALCITON, LATICACIDVEN in the last 168 hours.  Recent Results (from the past 240 hour(s))  SARS Coronavirus 2 by RT PCR (hospital order, performed in [facility] hospital lab) Nasopharyngeal  Nasopharyngeal Swab     Status: None   Collection Time: 07/27/20  4:53 PM   Specimen: Nasopharyngeal Swab  Result Value Ref Range Status   SARS Coronavirus 2 NEGATIVE NEGATIVE Final    Comment: (NOTE) SARS-CoV-2 target nucleic acids are NOT DETECTED.  The SARS-CoV-2 RNA is generally detectable in upper and lower respiratory specimens during the acute phase of infection. The lowest concentration of SARS-CoV-2 viral copies this assay can detect is 250 copies / mL. A negative result does not preclude SARS-CoV-2 infection and should not be used as the sole basis for treatment or other patient management decisions.  A negative result may occur with improper specimen collection / handling, submission of specimen other than nasopharyngeal swab, presence of viral mutation(s) within the areas targeted by this assay, and inadequate number of viral copies (<250 copies / mL). A negative result must be combined with clinical observations, patient history, and epidemiological information.  Fact Sheet for Patients:   BoilerBrush.com.cy  Fact Sheet for Healthcare Providers: https://pope.com/  This test is not yet approved or  cleared by the [place] FDA and has been authorized for detection and/or diagnosis of SARS-CoV-2 by FDA under an Emergency Use Authorization (EUA).  This EUA will remain in effect (meaning this test can be used) for the duration of the COVID-19 declaration under Section 564(b)(1) of the Act, 21 U.S.C. section 360bbb-3(b)(1), unless the authorization is terminated or revoked sooner.  Performed at [facility], [address], [place], [place] [address]          Radiology Studies: DG Chest 2 View  Result Date: 07/27/2020 CLINICAL DATA:  Syncope EXAM: CHEST - 2 VIEW COMPARISON:  05/21/2017 FINDINGS: Bullous change again noted within the right apex. No superimposed focal pulmonary infiltrate. Lungs are are  symmetrically well expanded. No pneumothorax or pleural effusion. Cardiac size within normal limits. Pulmonary vascularity normal. No acute bone abnormality. IMPRESSION: No acute cardiopulmonary disease. Electronically Signed   By: Helyn Numbers MD   On: 07/27/2020 17:42   CT Head Wo Contrast  Result Date: 07/27/2020 CLINICAL DATA:  Fall, head injury EXAM: CT HEAD WITHOUT CONTRAST CT CERVICAL SPINE WITHOUT CONTRAST TECHNIQUE: Multidetector CT imaging of the head and cervical spine was performed following the standard protocol without intravenous contrast. Multiplanar CT image reconstructions of the cervical spine were also generated. COMPARISON:  None. FINDINGS: CT HEAD FINDINGS Brain: Normal anatomic configuration. No abnormal intra or extra-axial mass lesion or fluid collection. No abnormal mass effect or midline shift. No evidence of acute intracranial hemorrhage or infarct. Ventricular size is normal. Cerebellum unremarkable. Vascular: Unremarkable Skull: Intact Sinuses/Orbits: There is moderate mucosal thickening within the frontal sinuses bilaterally without associated air-fluid levels. Remaining paranasal  sinuses are clear. Orbits are unremarkable. Other: Mastoid air cells and middle ear cavities are clear. There is mild soft tissue swelling superficial to the right supraorbital ridge. There is mild soft tissue infiltration in keeping with edema or subcutaneous hemorrhage involving the soft tissues of the right temporal fossa. CT CERVICAL SPINE FINDINGS Alignment: Normal cervical lordosis. No listhesis of the cervical spine. Skull base and vertebrae: The craniocervical junction is unremarkable. The atlantodental interval is normal. There is no acute cervical spine fracture. No lytic or blastic bone lesion is identified. Soft tissues and spinal canal: No prevertebral fluid or swelling. No visible canal hematoma. Disc levels: Review of the sagittal images demonstrates intervertebral disc space narrowing  and endplate remodeling at C5-6 and C6-7 in keeping with changes of moderate degenerative disc disease res. Small posteriorly oriented disc osteophytes are noted at this level. Remaining intervertebral disc heights and vertebral body heights are preserved. Review of the axial images demonstrates: C1-2: Unremarkable C2-3: Unremarkable C3-4: Mild bilateral uncovertebral disease. No significant facet arthrosis. Mild resultant bilateral neural foraminal narrowing, right greater than left. No significant canal stenosis. C4-5: Unremarkable. C5-6: Severe left uncovertebral arthrosis results in moderate left neural foraminal narrowing. Minimal right uncovertebral and facet arthrosis. No significant neural foraminal narrowing on the right. No significant canal stenosis. C6-7: Moderate bilateral uncovertebral arthrosis. Mild right and mild-to-moderate left neural foraminal narrowing. No significant canal stenosis. C7-T1: Severe right facet arthrosis. No significant neural foraminal narrowing. No significant canal stenosis. Upper chest: Right apical bullous change noted. Other: None significant IMPRESSION: 1. No acute intracranial abnormality. 2. No acute cervical spine fracture. 3. Moderate degenerative disc disease at C5-6 and C6-7. 4. Moderate left neural foraminal narrowing at C5-6 due to uncovertebral arthrosis. 5. Mild soft tissue swelling superficial to the right supraorbital ridge and the soft tissues of the right temporal fossa. Electronically Signed   By: Helyn Numbers MD   On: 07/27/2020 17:54   CT Cervical Spine Wo Contrast  Result Date: 07/27/2020 CLINICAL DATA:  Fall, head injury EXAM: CT HEAD WITHOUT CONTRAST CT CERVICAL SPINE WITHOUT CONTRAST TECHNIQUE: Multidetector CT imaging of the head and cervical spine was performed following the standard protocol without intravenous contrast. Multiplanar CT image reconstructions of the cervical spine were also generated. COMPARISON:  None. FINDINGS: CT HEAD FINDINGS  Brain: Normal anatomic configuration. No abnormal intra or extra-axial mass lesion or fluid collection. No abnormal mass effect or midline shift. No evidence of acute intracranial hemorrhage or infarct. Ventricular size is normal. Cerebellum unremarkable. Vascular: Unremarkable Skull: Intact Sinuses/Orbits: There is moderate mucosal thickening within the frontal sinuses bilaterally without associated air-fluid levels. Remaining paranasal sinuses are clear. Orbits are unremarkable. Other: Mastoid air cells and middle ear cavities are clear. There is mild soft tissue swelling superficial to the right supraorbital ridge. There is mild soft tissue infiltration in keeping with edema or subcutaneous hemorrhage involving the soft tissues of the right temporal fossa. CT CERVICAL SPINE FINDINGS Alignment: Normal cervical lordosis. No listhesis of the cervical spine. Skull base and vertebrae: The craniocervical junction is unremarkable. The atlantodental interval is normal. There is no acute cervical spine fracture. No lytic or blastic bone lesion is identified. Soft tissues and spinal canal: No prevertebral fluid or swelling. No visible canal hematoma. Disc levels: Review of the sagittal images demonstrates intervertebral disc space narrowing and endplate remodeling at C5-6 and C6-7 in keeping with changes of moderate degenerative disc disease res. Small posteriorly oriented disc osteophytes are noted at this level. Remaining intervertebral disc  heights and vertebral body heights are preserved. Review of the axial images demonstrates: C1-2: Unremarkable C2-3: Unremarkable C3-4: Mild bilateral uncovertebral disease. No significant facet arthrosis. Mild resultant bilateral neural foraminal narrowing, right greater than left. No significant canal stenosis. C4-5: Unremarkable. C5-6: Severe left uncovertebral arthrosis results in moderate left neural foraminal narrowing. Minimal right uncovertebral and facet arthrosis. No  significant neural foraminal narrowing on the right. No significant canal stenosis. C6-7: Moderate bilateral uncovertebral arthrosis. Mild right and mild-to-moderate left neural foraminal narrowing. No significant canal stenosis. C7-T1: Severe right facet arthrosis. No significant neural foraminal narrowing. No significant canal stenosis. Upper chest: Right apical bullous change noted. Other: None significant IMPRESSION: 1. No acute intracranial abnormality. 2. No acute cervical spine fracture. 3. Moderate degenerative disc disease at C5-6 and C6-7. 4. Moderate left neural foraminal narrowing at C5-6 due to uncovertebral arthrosis. 5. Mild soft tissue swelling superficial to the right supraorbital ridge and the soft tissues of the right temporal fossa. Electronically Signed   By: Helyn NumbersAshesh  Parikh MD   On: 07/27/2020 17:54   MR BRAIN WO CONTRAST  Result Date: 07/27/2020 CLINICAL DATA:  Neuro deficit, acute, stroke suspected. Additional history provided: Patient reports right-sided weakness, tunnel vision, stumbling, muscle weakness for 2 months. EXAM: MRI HEAD WITHOUT CONTRAST TECHNIQUE: Multiplanar, multiecho pulse sequences of the brain and surrounding structures were obtained without intravenous contrast. COMPARISON:  Head CT performed earlier the same day 07/27/2020. FINDINGS: Brain: The examination is intermittently motion degraded. Most notably, there is moderate/severe motion degradation of the sagittal T1 weighted sequence, moderate motion degradation of the axial T2/FLAIR sequence and mild-to-moderate motion degradation of the axial T1 weighted sequence. Mild generalized parenchymal atrophy. 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 Minimal background scattered T2/FLAIR hyperintensity within the cerebral white matter is nonspecific, but consistent with chronic small vessel ischemic disease. Tiny chronic infarct within the right cerebellar hemisphere. There is no acute  infarct. No evidence of intracranial mass. No chronic intracranial blood products. No extra-axial fluid collection. No midline shift. Vascular: Expected proximal arterial flow voids. Skull and upper cervical spine: No focal marrow lesion is identified within the limitations of motion degradation. Sinuses/Orbits: Visualized orbits show no acute finding. Mild diffuse paranasal sinus mucosal thickening. Trace fluid within right mastoid air cells. IMPRESSION: Intermittently motion degraded examination as described. No evidence of acute intracranial abnormality, including acute infarction. 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 Background mild generalized parenchymal atrophy and cerebral white matter chronic small vessel ischemic disease. Mild paranasal sinus mucosal thickening. Trace right mastoid effusion. Electronically Signed   By: Jackey LogeKyle  Golden DO   On: 07/27/2020 20:05   MR Cervical Spine Wo Contrast  Result Date: 07/27/2020 CLINICAL DATA:  Ataxia, cervical spine trauma EXAM: MRI CERVICAL SPINE WITHOUT CONTRAST TECHNIQUE: Multiplanar, multisequence MR imaging of the cervical spine was performed. No intravenous contrast was administered. COMPARISON:  CT same day FINDINGS: Alignment: Physiologic Vertebrae: The vertebral body heights are well maintained. Increased marrow signal seen at the endplates of C5-C6 and C6-C7. Cord: There is question of minimally increased STIR signal seen within the central cord at C5-C6 and C6-C7. No cord expansion however is noted. Posterior Fossa, vertebral arteries, paraspinal tissues: The visualized portion of the posterior fossa is unremarkable. Normal flow voids seen within the vertebral arteries. The paraspinal soft tissues are unremarkable. Disc levels: C1-C2: Atlanto-axial junction is normal, without canal narrowing C2-C3: No significant spinal canal or neural foraminal narrowing C3-C4: There  is a  disc osteophyte complex and uncovertebral osteophytes which causes severe neural foraminal narrowing. C4-C5: There is a disc osteophyte complex and uncovertebral osteophytes which causes moderate bilateral neural foraminal narrowing. C5-C6: There is a disc osteophyte complex and a left lateral recess disc protrusion which contacts and impinges the left C6 nerve root. There is severe left and moderate right neural foraminal narrowing. Mild central canal stenosis is noted. C6-C7: There is a disc osteophyte complex and uncovertebral osteophytes with a right far lateral disc protrusion which contacts and impinges the right C7 nerve root. There is severe bilateral neural foraminal narrowing and mild to moderate central canal stenosis. C7-T1: No significant spinal canal or neural foraminal narrowing IMPRESSION: 1. 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 2. Cervical spine spondylosis most notable C5-C6 with a left lateral recess disc protrusion contacting and impinging the left C6 nerve root with severe left neural foraminal narrowing and mild central canal stenosis. 3. Also at C6-C7 with a right far lateral disc protrusion contacting and pinching the right C7 nerve root with severe bilateral neural foraminal narrowing and mild to moderate central canal stenosis. Electronically Signed   By: Jonna Clark M.D.   On: 07/27/2020 23:00   DG Pelvis Portable  Result Date: 07/27/2020 CLINICAL DATA:  Pelvic pain, fall EXAM: PORTABLE PELVIS 1-2 VIEWS COMPARISON:  CT 05/21/2017 FINDINGS: There is no evidence of pelvic fracture or diastasis. No pelvic bone lesions are seen. IMPRESSION: Negative. Electronically Signed   By: Jasmine Pang M.D.   On: 07/27/2020 21:06        Scheduled Meds: . enoxaparin (LOVENOX) injection  40 mg Subcutaneous Q24H  . folic acid  1 mg Oral Daily  . LORazepam  0-4 mg Intravenous Q6H   Or  . LORazepam  0-4 mg Oral Q6H  . [START ON 07/30/2020] LORazepam   0-4 mg Intravenous Q12H   Or  . [START ON 07/30/2020] LORazepam  0-4 mg Oral Q12H  . multivitamin with minerals  1 tablet Oral Daily  . nicotine  21 mg Transdermal Daily  . pantoprazole  40 mg Oral Daily  . sodium chloride flush  3 mL Intravenous Q12H  . thiamine  100 mg Oral Daily   Or  . thiamine  100 mg Intravenous Daily  . thiamine  500 mg Intravenous TID  . vitamin B-12  1,000 mcg Oral Daily   Continuous Infusions: . methocarbamol (ROBAXIN) IV       LOS: 1 day    Time spent: 32 mins     Charise Killian, MD [facility] Pager [phone]  If 7PM-7AM, please contact night-coverage [email or web address] 07/28/2020, 8:09 AM

## 2020-07-28 NOTE — ED Notes (Signed)
PT at bedside.

## 2020-07-28 NOTE — TOC Initial Note (Signed)
Transition of Care [facility]) - Initial/Assessment Note    Patient Details  Name: Jeff Patton MRN: [id number] Date of Birth: 1966-07-01  [facility] CM/SW Contact:    Summers Cellar, RN Phone Number: 07/28/2020, 11:14 AM  Clinical Narrative:                 Spoke to patient at bedside. Patient states he lives alone and is independent with his care. Continues to work at [employer] five days a week. Patient states he has a cane however he rarely uses it. Drives as needed. Provided paperwork for [employer] including [facility] and Med mgmt for discharge. Patient states he has no need for SA resources as his drinking is controlled. Discussed PT recommendation for home health and patient states he is agreeable to home health however he is going to have to return to work as quick as possible to make some money. Patient states the MD told him he would be admitted overnight. TOC will follow up at discharge.   Expected Discharge Plan: Home w Home Health Services Barriers to Discharge: Continued Medical Work up   Patient Goals and CMS Choice Patient states their goals for this hospitalization and ongoing recovery are:: Get better      Expected Discharge Plan and Services Expected Discharge Plan: Home w Home Health Services       Living arrangements for the past 2 months: Single Family Home                                      Prior Living Arrangements/Services Living arrangements for the past 2 months: Single Family Home Lives with:: Self Patient language and need for interpreter reviewed:: Yes Do you feel safe going back to the place where you live?: Yes      Need for Family Participation in Patient Care: Yes (Comment) Care giver support system in place?: Yes (comment) Current home services: DME [facility]) Criminal Activity/Legal Involvement Pertinent to Current Situation/Hospitalization: No - Comment as needed  Activities of Daily Living       Permission Sought/Granted Permission sought to share information with : Facility Industrial/product designer granted to share information with : Yes, Verbal Permission Granted  Share Information with NAME: Home Health Care           Emotional Assessment Appearance:: Appears stated age Attitude/Demeanor/Rapport: Engaged Affect (typically observed): Calm, Accepting Orientation: : Oriented to Place, Oriented to Self, Oriented to  Time, Oriented to Situation Alcohol / Substance Use: Alcohol Use (Drinks 2-3 xweekly-states he does not drink on days he has to work) Psych Involvement: No (comment)  Admission diagnosis:  Ataxia [R27.0] Patient Active Problem List   Diagnosis Date Noted  . Ataxia 07/27/2020  . Sepsis (HCC) 05/21/2017  . CAP (community acquired pneumonia) 05/21/2017  . HTN (hypertension) 05/21/2017   PCP:  Patient, No Pcp Per Pharmacy:   [id number] [place], [place] - [address] [place] [address] Phone: [phone] Fax: [phone]     Social Determinants of Health (SDOH) Interventions    Readmission Risk Interventions No flowsheet data found.

## 2020-07-28 NOTE — Evaluation (Signed)
Physical Therapy Evaluation Patient Details Name: Jeff Patton MRN: [id number] DOB: 1966/11/15 Today's Date: 07/28/2020   History of Present Illness  Pt is a 54 y.o. male presenting to hospital 8/30 with possible syncopal episode and fall earlier today.  Pt admitted with syncope, ataxia, EtOH use disorder.  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  Per chart review, pt with h/o binge drinking.  Clinical Impression  Prior to hospital admission, pt was independent with ambulation; lives alone in 1 level home with 2 STE; working.  Currently pt is modified independent with bed mobility (on stretcher bed); CGA with transfers; and CGA ambulating 140 feet with RW (2nd assist for equipment management).  Pt initially appearing shaky and unsteady but improved balance and gait noted with use of RW (pt with more ataxic R LE movement first 20 feet of ambulation with RW but decreased ataxia noted with increased distance ambulating--pt still catching R foot on ground during R LE advancement intermittently but pt able to maintain balance without any issues with B UE support on RW).  Pt's BP 150/93 pre-ambulation and increased to 197/119 post ambulation; after a few minutes of rest in stretcher bed, pt's BP 179/126--nurse notified immediately of pt's elevated BP.  Pt would benefit from skilled PT to address noted impairments and functional limitations (see below for any additional details).  Upon hospital discharge, pt would benefit from HHPT.    Follow Up Recommendations Home health PT    Equipment Recommendations  Rolling walker with 5" wheels    Recommendations for Other Services OT consult     Precautions / Restrictions Precautions Precautions: Fall Restrictions Weight Bearing Restrictions: No      Mobility  Bed Mobility Overal bed mobility: Modified Independent             General bed mobility comments: Semi-supine to/from sitting  with mild increased time to perform on own  Transfers Overall transfer level: Needs assistance Equipment used: None Transfers: Sit to/from Stand Sit to Stand: Min guard         General transfer comment: CGA for safety initially (pt appearing shaky and unsteady standing without UE support) but improved balance and shaking disappeared within a minute of standing  Ambulation/Gait Ambulation/Gait assistance: Min guard;+2 safety/equipment Gait Distance (Feet): 140 Feet Assistive device: Rolling walker (2 wheeled)   Gait velocity: mildly decreased   General Gait Details: pt took a couple steps without UE support but R LE ataxia noted causing imbalance so switched to use of RW; pt initially with more ataxic R LE movement first 20 feet of ambulation with RW but decreased ataxia noted with increased distance ambulating (pt still catching R foot on ground during R LE advancement intermittently but pt able to maintain balance without any issues with B UE support on RW)  Stairs            Wheelchair Mobility    Modified Rankin (Stroke Patients Only)       Balance Overall balance assessment: Needs assistance Sitting-balance support: No upper extremity supported;Feet supported Sitting balance-Leahy Scale: Normal Sitting balance - Comments: steady sitting reaching outside BOS   Standing balance support: No upper extremity supported Standing balance-Leahy Scale: Poor Standing balance comment: pt initially unsteady/shaky standing without UE support but improved balance noted with at least single UE support  Pertinent Vitals/Pain Pain Assessment: 0-10 Pain Score: 3  Pain Location: abrasion on R forehead Pain Descriptors / Indicators: Sore;Tender Pain Intervention(s): Limited activity within patient's tolerance;Monitored during session;Repositioned  HR and O2 sats on room air WFL during sessions activities.    Home Living Family/patient  expects to be discharged to:: Private residence Living Arrangements: Alone   Type of Home: House Home Access: Stairs to enter Entrance Stairs-Rails: None Entrance Stairs-Number of Steps: 2 Home Layout: One level Home Equipment: Cane - single point      Prior Function Level of Independence: Independent         Comments: Working; h/o fall about 3 weeks ago (similar symptoms with tunnel vision).  Occasional use of cane.     Hand Dominance        Extremity/Trunk Assessment   Upper Extremity Assessment Upper Extremity Assessment: Defer to OT evaluation    Lower Extremity Assessment Lower Extremity Assessment: RLE deficits/detail;LLE deficits/detail (light touch intact B LE's) RLE Deficits / Details: hip flexion 3+/5; knee flexion/extension 4/5, DF 4/5 LLE Deficits / Details: hip flexion 4+/5; knee flexion/extension 4+/5; DF 4+/5    Cervical / Trunk Assessment Cervical / Trunk Assessment: Normal  Communication   Communication: No difficulties  Cognition Arousal/Alertness: Awake/alert Behavior During Therapy: WFL for tasks assessed/performed Overall Cognitive Status: Within Functional Limits for tasks assessed                                 General Comments: Pleasant and cooperative      General Comments   Nursing cleared pt for participation in physical therapy.  Pt agreeable to PT session.    Exercises  Gait training with RW   Assessment/Plan    PT Assessment Patient needs continued PT services  PT Problem List Decreased strength;Decreased activity tolerance;Decreased balance;Decreased mobility;Decreased knowledge of use of DME;Cardiopulmonary status limiting activity       PT Treatment Interventions DME instruction;Gait training;Stair training;Functional mobility training;Therapeutic activities;Therapeutic exercise;Balance training;Neuromuscular re-education;Patient/family education    PT Goals (Current goals can be found in the Care Plan  section)  Acute Rehab PT Goals Patient Stated Goal: to improve balance and walking PT Goal Formulation: With patient Time For Goal Achievement: 08/11/20 Potential to Achieve Goals: Good    Frequency Min 2X/week   Barriers to discharge        Co-evaluation               AM-PAC PT "6 Clicks" Mobility  Outcome Measure Help needed turning from your back to your side while in a flat bed without using bedrails?: None Help needed moving from lying on your back to sitting on the side of a flat bed without using bedrails?: None Help needed moving to and from a bed to a chair (including a wheelchair)?: A Little Help needed standing up from a chair using your arms (e.g., wheelchair or bedside chair)?: A Little Help needed to walk in hospital room?: A Little Help needed climbing 3-5 steps with a railing? : A Little 6 Click Score: 20    End of Session Equipment Utilized During Treatment: Gait belt Activity Tolerance: Other (comment) (Increased BP with activity--nurse notified) Patient left: in bed;with call bell/phone within reach Nurse Communication: Mobility status;Precautions (pt's BP status) PT Visit Diagnosis: Unsteadiness on feet (R26.81);Other abnormalities of gait and mobility (R26.89);History of falling (Z91.81)    Time: 4481-8563 PT Time Calculation (min) (ACUTE ONLY): 26 min   Charges:  PT Evaluation $PT Eval Low Complexity: 1 Low PT Treatments $Gait Training: 8-22 mins       Hendricks Limes, PT 07/28/20, 9:53 AM

## 2020-07-28 NOTE — Progress Notes (Signed)
Meds given for primary RN, Lorrie. Patient's BP elevated, no daily or PRN meds were ordered for BP. Patient stated that he does take something for BP, but could not remember what it was and that he did not take it like he was supposed too. Primary RN Lorrie notified that this RN sent message to Dr. Mayford Knife. Currently no response.  Jeff Patton

## 2020-07-28 NOTE — Evaluation (Signed)
Occupational Therapy Evaluation Patient Details Name: Jeff Patton MRN: [id number] DOB: 1966-02-15 Today's Date: 07/28/2020    History of Present Illness Pt is a 54 y.o. male presenting to hospital 8/30 with possible syncopal episode and fall earlier today.  Pt admitted with syncope, ataxia, EtOH use disorder.  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  Per chart review, pt with h/o binge drinking.   Clinical Impression   Jeff Patton was seen for OT evaluation this date. Prior to hospital admission, pt was Independent for mobility using SPC PRN and endorses hx of 2 falls this month. Pt lives alone c friends available PRN and works full time. Pt presents to acute OT demonstrating impaired ADL performance and functional mobility 2/2 decreased activity tolerance, functional strength/coordination deficits, and decreased safety awareness. Pt currently Independent self-feeding/drinking at bed level. MOD I simulated UBD at bed level. Anticipate MIN A for LBD c standing. Deferred mobility this session 2/2 elevated BP (190/120) at rest (visualized mobilizing in room c PT). Pt would benefit from skilled OT to address noted impairments and functional limitations (see below for any additional details) in order to maximize safety and independence while minimizing falls risk and caregiver burden. Upon hospital discharge, recommend HHOT to maximize pt safety and return to functional independence during meaningful occupations of daily life.    Follow Up Recommendations  Home health OT    Equipment Recommendations  None recommended by OT    Recommendations for Other Services       Precautions / Restrictions Precautions Precautions: Fall Restrictions Weight Bearing Restrictions: No      Mobility Bed Mobility Overal bed mobility: Modified Independent      General bed mobility comments: Visulaized c PT to be MOD I. Deferred this session 2/2  elevated BP (190/120)  Transfers Overall transfer level: Needs assistance Equipment used: None Transfers: Sit to/from Stand Sit to Stand: Min guard  General transfer comment: CGA for safety initially (pt appearing shaky and unsteady standing without UE support) but improved balance and shaking disappeared within a minute of standing    Balance Overall balance assessment: Needs assistance Sitting-balance support: No upper extremity supported;Feet supported Sitting balance-Leahy Scale: Normal Sitting balance - Comments: steady sitting reaching outside BOS   Standing balance support: No upper extremity supported Standing balance-Leahy Scale: Poor Standing balance comment: pt initially unsteady/shaky standing without UE support but improved balance noted with at least single UE support      ADL either performed or assessed with clinical judgement   ADL Overall ADL's : Needs assistance/impaired    General ADL Comments: Independent self-feeding/drinking at bed level. MOD I simulated UBD at bed level. Anticipate MIN A for LBD c standing.       Pertinent Vitals/Pain Pain Assessment: No/denies pain Pain Score: 3  Pain Location: abrasion on R forehead Pain Descriptors / Indicators: Sore;Tender Pain Intervention(s): Limited activity within patient's tolerance;Monitored during session;Repositioned     Hand Dominance Right   Extremity/Trunk Assessment Upper Extremity Assessment Upper Extremity Assessment: RUE deficits/detail;LUE deficits/detail RUE Deficits / Details: Achieves 5 finger opposition c increased time compared to L. Mild ataxia noted in RUE c RAM. AROM WFL grossly  LUE Deficits / Details: 5 finger opposition in tact. AROM WFL grossly. Grip 4/5   Lower Extremity Assessment Lower Extremity Assessment: Generalized weakness RLE Deficits / Details: hip flexion 3+/5; knee flexion/extension 4/5, DF 4/5 LLE Deficits / Details: hip flexion 4+/5; knee flexion/extension 4+/5; DF  4+/5   Cervical / Trunk Assessment Cervical / Trunk Assessment: Normal   Communication Communication Communication: No difficulties   Cognition Arousal/Alertness: Awake/alert Behavior During Therapy: WFL for tasks assessed/performed Overall Cognitive Status: Within Functional Limits for tasks assessed    General Comments: Pleasant and cooperative   General Comments  Long sitting in bed: BP 190/120, MAP 140    Exercises Exercises: Other exercises Other Exercises Other Exercises: Pt educated re: OT role, DME recs, d/c recs, falls prevention, ECS, importance of nutrition for safe mobility, home/routines modifications Other Exercises: Self-feeding, simulated UBD/LBD   Shoulder Instructions      Home Living Family/patient expects to be discharged to:: Private residence Living Arrangements: Alone Available Help at Discharge: Friend(s);Available PRN/intermittently Type of Home: House Home Access: Stairs to enter [employer] of Steps: 2 Entrance Stairs-Rails: None Home Layout: One level     Bathroom Shower/Tub: Chief Strategy Officer: Standard     Home Equipment: Cane - single point   Additional Comments: Pt has small dog, pig, and 2 cats      Prior Functioning/Environment Level of Independence: Independent        Comments: Working full time - requires standing t/o shift; h/o fall about 3 weeks ago (similar symptoms with tunnel vision).  Occasional use of cane.        OT Problem List: Decreased range of motion;Decreased activity tolerance;Decreased knowledge of use of DME or AE      OT Treatment/Interventions: Self-care/ADL training;Therapeutic exercise;Energy conservation;DME and/or AE instruction;Therapeutic activities;Patient/family education;Balance training    OT Goals(Current goals can be found in the care plan section) Acute Rehab OT Goals Patient Stated Goal: to improve balance and walking OT Goal Formulation: With patient Time For  Goal Achievement: 08/11/20 Potential to Achieve Goals: Good ADL Goals Pt Will Perform Grooming: with modified independence;standing (c LRAD PRN) Pt Will Perform Lower Body Dressing: with modified independence;sit to/from stand (c LRAD PRN) Pt Will Transfer to Toilet: with modified independence;ambulating;regular height toilet (c LRAD PRN) Additional ADL Goal #1: Pt will Independently verbalize plan to implement x3 falls prevention strategies.  OT Frequency: Min 1X/week   Barriers to D/C: Inaccessible home environment;Decreased caregiver support          Co-evaluation              AM-PAC OT "6 Clicks" Daily Activity     Outcome Measure Help from another person eating meals?: None Help from another person taking care of personal grooming?: None Help from another person toileting, which includes using toliet, bedpan, or urinal?: A Little Help from another person bathing (including washing, rinsing, drying)?: A Little Help from another person to put on and taking off regular upper body clothing?: A Little Help from another person to put on and taking off regular lower body clothing?: A Little 6 Click Score: 20   End of Session Nurse Communication: Mobility status  Activity Tolerance: Patient tolerated treatment well Patient left: in bed;with call bell/phone within reach  OT Visit Diagnosis: Other abnormalities of gait and mobility (R26.89)                Time: 3244-0102 OT Time Calculation (min): 17 min Charges:  OT General Charges $OT Visit: 1 Visit OT Evaluation $OT Eval Low Complexity: 1 Low OT Treatments $Self Care/Home Management : 8-22 mins  Kathie Dike, M.S. OTR/L  07/28/20, 11:05 AM  ascom [phone]

## 2020-07-29 DIAGNOSIS — R531 Weakness: Secondary | ICD-10-CM

## 2020-07-29 DIAGNOSIS — F1010 Alcohol abuse, uncomplicated: Secondary | ICD-10-CM

## 2020-07-29 DIAGNOSIS — R55 Syncope and collapse: Secondary | ICD-10-CM

## 2020-07-29 DIAGNOSIS — E538 Deficiency of other specified B group vitamins: Secondary | ICD-10-CM | POA: Diagnosis present

## 2020-07-29 LAB — CBC
Hemoglobin: 16.6 g/dL (ref 13.0–17.0)
Platelets: 206 10*3/uL (ref 150–400)
WBC: 7.1 10*3/uL (ref 4.0–10.5)

## 2020-07-29 LAB — COMPREHENSIVE METABOLIC PANEL
ALT: 31 U/L (ref 0–44)
AST: 31 U/L (ref 15–41)
Albumin: 3.4 g/dL — ABNORMAL LOW (ref 3.5–5.0)
Alkaline Phosphatase: 55 U/L (ref 38–126)
Anion gap: 7 (ref 5–15)
BUN: 15 mg/dL (ref 6–20)
CO2: 24 mmol/L (ref 22–32)
Calcium: 8.8 mg/dL — ABNORMAL LOW (ref 8.9–10.3)
Chloride: 101 mmol/L (ref 98–111)
Creatinine, Ser: 0.95 mg/dL (ref 0.61–1.24)
GFR calc Af Amer: >60 mL/min (ref >60)
GFR calc non Af Amer: >60 mL/min (ref >60)
Glucose, Bld: 93 mg/dL (ref 70–99)
Potassium: 3.8 mmol/L (ref 3.5–5.1)
Sodium: 132 mmol/L — ABNORMAL LOW (ref 135–145)
Total Bilirubin: 1.1 mg/dL (ref 0.3–1.2)
Total Protein: 6.5 g/dL (ref 6.5–8.1)

## 2020-07-29 LAB — FOLATE: Folate: 28 ng/mL (ref >5.9)

## 2020-07-29 LAB — SEDIMENTATION RATE: Sed Rate: 3 mm/hr (ref 0–20)

## 2020-07-29 LAB — C-REACTIVE PROTEIN: CRP: 0.6 mg/dL (ref <1.0)

## 2020-07-29 MED ORDER — KETOROLAC TROMETHAMINE 30 MG/ML IJ SOLN
30.0000 mg | Freq: Once | INTRAMUSCULAR | Status: AC
  Administered 2020-07-29: 30 mg via INTRAVENOUS
  Filled 2020-07-29: qty 1

## 2020-07-29 MED ORDER — CYANOCOBALAMIN 1000 MCG/ML IJ SOLN
1000.0000 ug | Freq: Every day | INTRAMUSCULAR | Status: AC
  Administered 2020-07-29 – 2020-08-02 (×5): 1000 ug via INTRAMUSCULAR
  Filled 2020-07-29 (×5): qty 1

## 2020-07-29 MED ORDER — CAPSAICIN 0.025 % EX CREA
TOPICAL_CREAM | Freq: Two times a day (BID) | CUTANEOUS | Status: DC
  Filled 2020-07-29 (×2): qty 60

## 2020-07-29 NOTE — Progress Notes (Signed)
PROGRESS NOTE    Jeff Patton  OXB:[id number] DOB: [birth year] DOA: 07/27/2020 PCP: Patient, No Pcp Per    Subjective:  The patient was seen and examined this morning, stable still complaining of severe generalized weakness. Otherwise hemodynamically stable No issues overnight     HPI/Hospital course:  Jeff Patton is a 54 y.o. male with past medical history of hypertension, tobacco abuse, COPD, remote subsegmental PE, prior stroke with residual right hemiparesis and excessive alcohol use presents to the emergency department with after patient had a syncopal event and fall.  Patient has been having difficulty walking for the last couple months with worsening gait imbalance.   ED: Patient was noted to have truncal ataxia and positive Romberg.  CT head as well as MRI brain which did not show any acute findings.   Macrocytosis as well as B12 deficiency at B12 level 175.   TSH was normal.    Patient admitted and started on oral B12 replacement.   Neurology consulted for evaluation of ataxia.   Patient  started on thiamine and Ativan for concern for possible withdrawal      Assessment & Plan:   Active Problems:   Ataxia  Syncope:  -Unclear etiology, vasovagal versus cardiogenic versus orthostatic -Continue monitoring very closely, current work-up including . CT head/ C-spine shows no acute intracranial findings, no c-spine fracture & mild soft tissue swelling superficial to the right of the supraorbital ridge & the soft tissues of the right temporal fossa -MRI of the head negative for any acute ischemic changes  -Continue with IV fluid resuscitation, orthostatic checks, neurochecks -No further episodes   Ataxia /B12 deficiency Neurology consult, CT of the head MRI of the head was reviewed, -Neurology indicated that patient is likely suffering from B12 deficiency, recommended Injection cyanocobalamin 1000 MCG x 5days, then once a week into 1 month  -Further  work-up including MMA, folic acid A83, copper, ceruloplasmin, ESR, C RP, vitamin D level  hx of alcohol abuse. No ethanol level done on admission, so ethanol level today is <10. PT/OT  -Continue CIWA protocol, thiamine folate, multivitamin, Alcohol cessation counseling     Elevated BP:  Blood pressure is improved, patient was started on amlodipine on this admission -As needed hydralazine Patient likely need a prescription for amlodipine on discharge      DVT prophylaxis: lovenox Code Status: full  Family Communication:called pt's daughter, Otho Ket, but no answer and unable to leave a voicemail  Disposition Plan: depends on PT/OT recs    Consultants:      Procedures:    Antimicrobials:   Subjective: Pt c/o eye pain   Objective: Vitals:   07/28/20 2041 07/28/20 2356 07/29/20 0527 07/29/20 1227  BP: (!) 167/95 (!) 163/99 (!) 136/93 (!) 137/95  Pulse: 92 84 72 80  Resp:  14  20  Temp: 97.8 F (36.6 C) 97.9 F (36.6 C) 97.8 F (36.6 C) 97.8 F (36.6 C)  TempSrc: Oral Oral Oral   SpO2: 99% 97% 99% 99%  Weight:      Height:        Intake/Output Summary (Last 24 hours) at 07/29/2020 1614 Last data filed at 07/29/2020 1027 Gross per 24 hour  Intake --  Output 1950 ml  Net -1950 ml   Filed Weights   07/27/20 0736  Weight: 90.7 kg      Physical Exam:   General:  Alert, oriented, cooperative, no distress;   HEENT:  Normocephalic, PERRL, otherwise with in Normal limits  Neuro:  CNII-XII intact. , normal motor and sensation, reflexes intact   Lungs:   Clear to auscultation BL, Respirations unlabored, no wheezes / crackles  Cardio:    S1/S2, RRR, No murmure, No Rubs or Gallops   Abdomen:   Soft, non-tender, bowel sounds active all four quadrants,  no guarding or peritoneal signs.  Muscular skeletal:  Limited exam - in bed, able to move all 4 extremities, bilateral lower extremity weakness 2+ pulses,  symmetric, No pitting edema  Skin:  Dry, warm to touch,  negative for any Rashes, No open wounds  Wounds: Please see nursing documentation          Data Reviewed: I have personally reviewed following labs and imaging studies  CBC: Recent Labs  Lab 07/27/20 0739 07/28/20 0346 07/29/20 0537  WBC 10.0 8.1 7.1  HGB 17.6* 15.7 16.6  HCT 47.3 43.5 RESULTS UNAVAILABLE DUE TO INTERFERING SUBSTANCE  MCV 104.2* 106.1* RESULTS UNAVAILABLE DUE TO INTERFERING SUBSTANCE  PLT 231 191 938   Basic Metabolic Panel: Recent Labs  Lab 07/27/20 0739 07/27/20 1639 07/28/20 0346 07/29/20 0537  NA 136  --  135 132*  K 3.1*  --  3.9 3.8  CL 101  --  103 101  CO2 24  --  28 24  GLUCOSE 110*  --  96 93  BUN 10  --  10 15  CREATININE 0.87  --  0.83 0.95  CALCIUM 9.3  --  8.6* 8.8*  MG  --  2.0  --   --    GFR: Estimated Creatinine Clearance: 103.1 mL/min (by C-G formula based on SCr of 0.95 mg/dL). Liver Function Tests: Recent Labs  Lab 07/27/20 1639 07/28/20 0346 07/29/20 0537  AST 33 29 31  ALT 37 29 31  ALKPHOS 66 51 55  BILITOT 0.9 1.3* 1.1  PROT 8.1 6.3* 6.5  ALBUMIN 4.1 3.3* 3.4*   No results for input(s): LIPASE, AMYLASE in the last 168 hours. Recent Labs  Lab 07/27/20 1653  AMMONIA 38*   Coagulation Profile: No results for input(s): INR, PROTIME in the last 168 hours. Cardiac Enzymes: No results for input(s): CKTOTAL, CKMB, CKMBINDEX, TROPONINI in the last 168 hours. BNP (last 3 results) No results for input(s): PROBNP in the last 8760 hours. HbA1C: No results for input(s): HGBA1C in the last 72 hours. CBG: No results for input(s): GLUCAP in the last 168 hours. Lipid Profile: No results for input(s): CHOL, HDL, LDLCALC, TRIG, CHOLHDL, LDLDIRECT in the last 72 hours. Thyroid Function Tests: Recent Labs    07/27/20 1653  TSH 1.400   Anemia Panel: Recent Labs    07/27/20 0739  VITAMINB12 175*   Sepsis Labs: No results for input(s): PROCALCITON, LATICACIDVEN in the last 168 hours.  Recent Results (from the past  240 hour(s))  SARS Coronavirus 2 by RT PCR (hospital order, performed in [facility] hospital lab) Nasopharyngeal Nasopharyngeal Swab     Status: None   Collection Time: 07/27/20  4:53 PM   Specimen: Nasopharyngeal Swab  Result Value Ref Range Status   SARS Coronavirus 2 NEGATIVE NEGATIVE Final    Comment: (NOTE) SARS-CoV-2 target nucleic acids are NOT DETECTED.  The SARS-CoV-2 RNA is generally detectable in upper and lower respiratory specimens during the acute phase of infection. The lowest concentration of SARS-CoV-2 viral copies this assay can detect is 250 copies / mL. A negative result does not preclude SARS-CoV-2 infection and should not be used as the sole basis for treatment or other  patient management decisions.  A negative result may occur with improper specimen collection / handling, submission of specimen other than nasopharyngeal swab, presence of viral mutation(s) within the areas targeted by this assay, and inadequate number of viral copies (<250 copies / mL). A negative result must be combined with clinical observations, patient history, and epidemiological information.  Fact Sheet for Patients:   BoilerBrush.com.cy  Fact Sheet for Healthcare Providers: https://pope.com/  This test is not yet approved or  cleared by the [place] FDA and has been authorized for detection and/or diagnosis of SARS-CoV-2 by FDA under an Emergency Use Authorization (EUA).  This EUA will remain in effect (meaning this test can be used) for the duration of the COVID-19 declaration under Section 564(b)(1) of the Act, 21 U.S.C. section 360bbb-3(b)(1), unless the authorization is terminated or revoked sooner.  Performed at [facility], [address] [place], [place] [address]          Radiology Studies: DG Chest 2 View  Result Date: 07/27/2020 CLINICAL DATA:  Syncope EXAM: CHEST - 2 VIEW COMPARISON:  05/21/2017  FINDINGS: Bullous change again noted within the right apex. No superimposed focal pulmonary infiltrate. Lungs are are symmetrically well expanded. No pneumothorax or pleural effusion. Cardiac size within normal limits. Pulmonary vascularity normal. No acute bone abnormality. IMPRESSION: No acute cardiopulmonary disease. Electronically Signed   By: Helyn Numbers MD   On: 07/27/2020 17:42   CT Head Wo Contrast  Result Date: 07/27/2020 CLINICAL DATA:  Fall, head injury EXAM: CT HEAD WITHOUT CONTRAST CT CERVICAL SPINE WITHOUT CONTRAST TECHNIQUE: Multidetector CT imaging of the head and cervical spine was performed following the standard protocol without intravenous contrast. Multiplanar CT image reconstructions of the cervical spine were also generated. COMPARISON:  None. FINDINGS: CT HEAD FINDINGS Brain: Normal anatomic configuration. No abnormal intra or extra-axial mass lesion or fluid collection. No abnormal mass effect or midline shift. No evidence of acute intracranial hemorrhage or infarct. Ventricular size is normal. Cerebellum unremarkable. Vascular: Unremarkable Skull: Intact Sinuses/Orbits: There is moderate mucosal thickening within the frontal sinuses bilaterally without associated air-fluid levels. Remaining paranasal sinuses are clear. Orbits are unremarkable. Other: Mastoid air cells and middle ear cavities are clear. There is mild soft tissue swelling superficial to the right supraorbital ridge. There is mild soft tissue infiltration in keeping with edema or subcutaneous hemorrhage involving the soft tissues of the right temporal fossa. CT CERVICAL SPINE FINDINGS Alignment: Normal cervical lordosis. No listhesis of the cervical spine. Skull base and vertebrae: The craniocervical junction is unremarkable. The atlantodental interval is normal. There is no acute cervical spine fracture. No lytic or blastic bone lesion is identified. Soft tissues and spinal canal: No prevertebral fluid or swelling. No  visible canal hematoma. Disc levels: Review of the sagittal images demonstrates intervertebral disc space narrowing and endplate remodeling at C5-6 and C6-7 in keeping with changes of moderate degenerative disc disease res. Small posteriorly oriented disc osteophytes are noted at this level. Remaining intervertebral disc heights and vertebral body heights are preserved. Review of the axial images demonstrates: C1-2: Unremarkable C2-3: Unremarkable C3-4: Mild bilateral uncovertebral disease. No significant facet arthrosis. Mild resultant bilateral neural foraminal narrowing, right greater than left. No significant canal stenosis. C4-5: Unremarkable. C5-6: Severe left uncovertebral arthrosis results in moderate left neural foraminal narrowing. Minimal right uncovertebral and facet arthrosis. No significant neural foraminal narrowing on the right. No significant canal stenosis. C6-7: Moderate bilateral uncovertebral arthrosis. Mild right and mild-to-moderate left neural foraminal narrowing. No significant  canal stenosis. C7-T1: Severe right facet arthrosis. No significant neural foraminal narrowing. No significant canal stenosis. Upper chest: Right apical bullous change noted. Other: None significant IMPRESSION: 1. No acute intracranial abnormality. 2. No acute cervical spine fracture. 3. Moderate degenerative disc disease at C5-6 and C6-7. 4. Moderate left neural foraminal narrowing at C5-6 due to uncovertebral arthrosis. 5. Mild soft tissue swelling superficial to the right supraorbital ridge and the soft tissues of the right temporal fossa. Electronically Signed   By: Fidela Salisbury MD   On: 07/27/2020 17:54   CT Cervical Spine Wo Contrast  Result Date: 07/27/2020 CLINICAL DATA:  Fall, head injury EXAM: CT HEAD WITHOUT CONTRAST CT CERVICAL SPINE WITHOUT CONTRAST TECHNIQUE: Multidetector CT imaging of the head and cervical spine was performed following the standard protocol without intravenous contrast.  Multiplanar CT image reconstructions of the cervical spine were also generated. COMPARISON:  None. FINDINGS: CT HEAD FINDINGS Brain: Normal anatomic configuration. No abnormal intra or extra-axial mass lesion or fluid collection. No abnormal mass effect or midline shift. No evidence of acute intracranial hemorrhage or infarct. Ventricular size is normal. Cerebellum unremarkable. Vascular: Unremarkable Skull: Intact Sinuses/Orbits: There is moderate mucosal thickening within the frontal sinuses bilaterally without associated air-fluid levels. Remaining paranasal sinuses are clear. Orbits are unremarkable. Other: Mastoid air cells and middle ear cavities are clear. There is mild soft tissue swelling superficial to the right supraorbital ridge. There is mild soft tissue infiltration in keeping with edema or subcutaneous hemorrhage involving the soft tissues of the right temporal fossa. CT CERVICAL SPINE FINDINGS Alignment: Normal cervical lordosis. No listhesis of the cervical spine. Skull base and vertebrae: The craniocervical junction is unremarkable. The atlantodental interval is normal. There is no acute cervical spine fracture. No lytic or blastic bone lesion is identified. Soft tissues and spinal canal: No prevertebral fluid or swelling. No visible canal hematoma. Disc levels: Review of the sagittal images demonstrates intervertebral disc space narrowing and endplate remodeling at I0-9 and C6-7 in keeping with changes of moderate degenerative disc disease res. Small posteriorly oriented disc osteophytes are noted at this level. Remaining intervertebral disc heights and vertebral body heights are preserved. Review of the axial images demonstrates: C1-2: Unremarkable C2-3: Unremarkable C3-4: Mild bilateral uncovertebral disease. No significant facet arthrosis. Mild resultant bilateral neural foraminal narrowing, right greater than left. No significant canal stenosis. C4-5: Unremarkable. C5-6: Severe left  uncovertebral arthrosis results in moderate left neural foraminal narrowing. Minimal right uncovertebral and facet arthrosis. No significant neural foraminal narrowing on the right. No significant canal stenosis. C6-7: Moderate bilateral uncovertebral arthrosis. Mild right and mild-to-moderate left neural foraminal narrowing. No significant canal stenosis. C7-T1: Severe right facet arthrosis. No significant neural foraminal narrowing. No significant canal stenosis. Upper chest: Right apical bullous change noted. Other: None significant IMPRESSION: 1. No acute intracranial abnormality. 2. No acute cervical spine fracture. 3. Moderate degenerative disc disease at C5-6 and C6-7. 4. Moderate left neural foraminal narrowing at C5-6 due to uncovertebral arthrosis. 5. Mild soft tissue swelling superficial to the right supraorbital ridge and the soft tissues of the right temporal fossa. Electronically Signed   By: Fidela Salisbury MD   On: 07/27/2020 17:54   MR BRAIN WO CONTRAST  Result Date: 07/27/2020 CLINICAL DATA:  Neuro deficit, acute, stroke suspected. Additional history provided: Patient reports right-sided weakness, tunnel vision, stumbling, muscle weakness for 2 months. EXAM: MRI HEAD WITHOUT CONTRAST TECHNIQUE: Multiplanar, multiecho pulse sequences of the brain and surrounding structures were obtained without intravenous contrast. COMPARISON:  Head CT performed earlier the same day 07/27/2020. FINDINGS: Brain: The examination is intermittently motion degraded. Most notably, there is moderate/severe motion degradation of the sagittal T1 weighted sequence, moderate motion degradation of the axial T2/FLAIR sequence and mild-to-moderate motion degradation of the axial T1 weighted sequence. Mild generalized parenchymal atrophy. 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 Minimal background scattered T2/FLAIR hyperintensity within the cerebral white matter is nonspecific, but consistent  with chronic small vessel ischemic disease. Tiny chronic infarct within the right cerebellar hemisphere. There is no acute infarct. No evidence of intracranial mass. No chronic intracranial blood products. No extra-axial fluid collection. No midline shift. Vascular: Expected proximal arterial flow voids. Skull and upper cervical spine: No focal marrow lesion is identified within the limitations of motion degradation. Sinuses/Orbits: Visualized orbits show no acute finding. Mild diffuse paranasal sinus mucosal thickening. Trace fluid within right mastoid air cells. IMPRESSION: Intermittently motion degraded examination as described. No evidence of acute intracranial abnormality, including acute infarction. 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 Background mild generalized parenchymal atrophy and cerebral white matter chronic small vessel ischemic disease. Mild paranasal sinus mucosal thickening. Trace right mastoid effusion. Electronically Signed   By: Kellie Simmering DO   On: 07/27/2020 20:05   MR Cervical Spine Wo Contrast  Result Date: 07/27/2020 CLINICAL DATA:  Ataxia, cervical spine trauma EXAM: MRI CERVICAL SPINE WITHOUT CONTRAST TECHNIQUE: Multiplanar, multisequence MR imaging of the cervical spine was performed. No intravenous contrast was administered. COMPARISON:  CT same day FINDINGS: Alignment: Physiologic Vertebrae: The vertebral body heights are well maintained. Increased marrow signal seen at the endplates of M3-N3 and I1-W4. Cord: There is question of minimally increased STIR signal seen within the central cord at C5-C6 and C6-C7. No cord expansion however is noted. Posterior Fossa, vertebral arteries, paraspinal tissues: The visualized portion of the posterior fossa is unremarkable. Normal flow voids seen within the vertebral arteries. The paraspinal soft tissues are unremarkable. Disc levels: C1-C2: Atlanto-axial  junction is normal, without canal narrowing C2-C3: No significant spinal canal or neural foraminal narrowing C3-C4: There is a disc osteophyte complex and uncovertebral osteophytes which causes severe neural foraminal narrowing. C4-C5: There is a disc osteophyte complex and uncovertebral osteophytes which causes moderate bilateral neural foraminal narrowing. C5-C6: There is a disc osteophyte complex and a left lateral recess disc protrusion which contacts and impinges the left C6 nerve root. There is severe left and moderate right neural foraminal narrowing. Mild central canal stenosis is noted. C6-C7: There is a disc osteophyte complex and uncovertebral osteophytes with a right far lateral disc protrusion which contacts and impinges the right C7 nerve root. There is severe bilateral neural foraminal narrowing and mild to moderate central canal stenosis. C7-T1: No significant spinal canal or neural foraminal narrowing IMPRESSION: 1. 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 2. Cervical spine spondylosis most notable C5-C6 with a left lateral recess disc protrusion contacting and impinging the left C6 nerve root with severe left neural foraminal narrowing and mild central canal stenosis. 3. Also at C6-C7 with a right far lateral disc protrusion contacting and pinching the right C7 nerve root with severe bilateral neural foraminal narrowing and mild to moderate central canal stenosis. Electronically Signed   By: Prudencio Pair M.D.   On: 07/27/2020 23:00   DG Pelvis Portable  Result Date: 07/27/2020 CLINICAL DATA:  Pelvic pain, fall EXAM: PORTABLE PELVIS 1-2 VIEWS COMPARISON:  CT  05/21/2017 FINDINGS: There is no evidence of pelvic fracture or diastasis. No pelvic bone lesions are seen. IMPRESSION: Negative. Electronically Signed   By: Donavan Foil M.D.   On: 07/27/2020 21:06   ECHOCARDIOGRAM COMPLETE  Result Date: 07/28/2020    ECHOCARDIOGRAM REPORT   Patient Name:    DOMENICK QUEBEDEAUX Date of Exam: 07/28/2020 Medical Rec #:  [id number]        Height:       70.0 in Accession #:    [id number]       Weight:       200.0 lb Date of Birth:  1966-10-18       BSA:          2.087 m Patient Age:    30 years         BP:           183/110 mmHg Patient Gender: M                HR:           70 bpm. Exam Location:  [facility] Procedure: 2D Echo, Color Doppler, Cardiac Doppler and Intracardiac            Opacification Agent Indications:     R55 Syncope  History:         Patient has no prior history of Echocardiogram examinations.                  Stroke; Risk Factors:Hypertension. Hx of blood clot.  Sonographer:     Charmayne Sheer RDCS (AE) Referring Phys:  [id number] Annice Needy ECKSTAT Diagnosing Phys: Bartholome Bill MD  Sonographer Comments: Suboptimal apical window. IMPRESSIONS  1. Left ventricular ejection fraction, by estimation, is 65 to 70%. Left ventricular ejection fraction by PLAX is 71 %. The left ventricle has normal function. The left ventricle has no regional wall motion abnormalities. Left ventricular diastolic parameters were normal.  2. Right ventricular systolic function is normal. The right ventricular size is normal.  3. The mitral valve is grossly normal. No evidence of mitral valve regurgitation.  4. The aortic valve is grossly normal. Aortic valve regurgitation is trivial.  5. Aortic dilatation noted. There is borderline dilatation of the aortic root measuring 37 mm. FINDINGS  Left Ventricle: Left ventricular ejection fraction, by estimation, is 65 to 70%. Left ventricular ejection fraction by PLAX is 71 %. The left ventricle has normal function. The left ventricle has no regional wall motion abnormalities. Definity contrast agent was given IV to delineate the left ventricular endocardial borders. The left ventricular internal cavity size was normal in size. There is borderline left ventricular hypertrophy. Left ventricular diastolic parameters were normal. Right Ventricle: The right  ventricular size is normal. No increase in right ventricular wall thickness. Right ventricular systolic function is normal. Left Atrium: Left atrial size was normal in size. Right Atrium: Right atrial size was normal in size. Pericardium: There is no evidence of pericardial effusion. Mitral Valve: The mitral valve is grossly normal. No evidence of mitral valve regurgitation. MV peak gradient, 1.7 mmHg. The mean mitral valve gradient is 1.0 mmHg. Tricuspid Valve: The tricuspid valve is grossly normal. Tricuspid valve regurgitation is not demonstrated. Aortic Valve: The aortic valve is grossly normal. Aortic valve regurgitation is trivial. Aortic valve mean gradient measures 2.0 mmHg. Aortic valve peak gradient measures 3.8 mmHg. Aortic valve area, by VTI measures 4.03 cm. Pulmonic Valve: The pulmonic valve was not well visualized. Pulmonic valve regurgitation is not visualized. Aorta:  Aortic dilatation noted. There is borderline dilatation of the aortic root measuring 37 mm. IAS/Shunts: The interatrial septum was not assessed.  LEFT VENTRICLE PLAX 2D LV EF:         Left            Diastology                ventricular     LV e' lateral:   8.05 cm/s                ejection        LV E/e' lateral: 6.7                fraction by     LV e' medial:    5.55 cm/s                PLAX is 71      LV E/e' medial:  9.7                %. LVIDd:         4.47 cm LVIDs:         2.69 cm LV PW:         1.12 cm LV IVS:        1.03 cm LVOT diam:     2.50 cm LV SV:         74 LV SV Index:   36 LVOT Area:     4.91 cm  LEFT ATRIUM             Index LA diam:        3.40 cm 1.63 cm/m LA Vol (A2C):   36.2 ml 17.34 ml/m LA Vol (A4C):   26.4 ml 12.65 ml/m LA Biplane Vol: 31.6 ml 15.14 ml/m  AORTIC VALVE                   PULMONIC VALVE AV Area (Vmax):    3.88 cm    PV Vmax:       0.69 m/s AV Area (Vmean):   4.06 cm    PV Vmean:      49.600 cm/s AV Area (VTI):     4.03 cm    PV VTI:        0.130 m AV Vmax:           97.40 cm/s  PV Peak  grad:  1.9 mmHg AV Vmean:          63.800 cm/s PV Mean grad:  1.0 mmHg AV VTI:            0.184 m AV Peak Grad:      3.8 mmHg AV Mean Grad:      2.0 mmHg LVOT Vmax:         76.90 cm/s LVOT Vmean:        52.800 cm/s LVOT VTI:          0.151 m LVOT/AV VTI ratio: 0.82  AORTA Ao Root diam: 3.70 cm MITRAL VALVE MV Area (PHT): 4.89 cm    SHUNTS MV Peak grad:  1.7 mmHg    Systemic VTI:  0.15 m MV Mean grad:  1.0 mmHg    Systemic Diam: 2.50 cm MV Vmax:       0.66 m/s MV Vmean:      39.6 cm/s MV Decel Time: 155 msec MV E velocity: 53.80 cm/s MV A velocity: 59.70 cm/s MV E/A ratio:  0.90 Bartholome Bill MD Electronically signed by  Bartholome Bill MD Signature Date/Time: 07/28/2020/2:53:18 PM    Final         Scheduled Meds:  amLODipine  10 mg Oral Daily   capsaicin   Topical BID   cyanocobalamin  1,000 mcg Intramuscular Daily   enoxaparin (LOVENOX) injection  40 mg Subcutaneous G31D   folic acid  1 mg Oral Daily   LORazepam  0-4 mg Intravenous Q6H   Or   LORazepam  0-4 mg Oral Q6H   [START ON 07/30/2020] LORazepam  0-4 mg Intravenous Q12H   Or   [START ON 07/30/2020] LORazepam  0-4 mg Oral Q12H   multivitamin with minerals  1 tablet Oral Daily   nicotine  21 mg Transdermal Daily   pantoprazole  40 mg Oral Daily   sodium chloride flush  3 mL Intravenous Q12H   thiamine  100 mg Oral Daily   Or   thiamine  100 mg Intravenous Daily   Continuous Infusions:  methocarbamol (ROBAXIN) IV       LOS: 2 days    Time spent: 32 mins     Deatra James, MD [facility] Pager [phone]  If 7PM-7AM, please contact night-coverage [email or web address] 07/29/2020, 4:14 PM

## 2020-07-29 NOTE — Consult Note (Signed)
Requesting Physician: Dr. Skipper Cliche    Chief Complaint:   History obtained from: Patient and Chart    HPI:                                                                                                                                       Jeff Patton is a 54 y.o. male with past medical history of hypertension, tobacco abuse, COPD, remote subsegmental PE, prior stroke with residual right hemiparesis and excessive alcohol use presents to the emergency department with after patient had a syncopal event and fall.  Patient has been having difficulty walking for the last couple months with worsening gait imbalance.  In the emergency department patient was noted to have truncal ataxia and positive Romberg.  Work-up included CT head as well as MRI brain which did not show any acute findings.  Work-up revealed  patient has macrocytosis as well as B12 deficiency at B12 level 175.  TSH was normal.  Patient admitted and started on oral B12 replacement.  Neurology consulted for evaluation of ataxia.  Patient also started on thiamine and Ativan for concern for possible withdrawal.     Past Medical History:  Diagnosis Date  . Hypertension     Past Surgical History:  Procedure Laterality Date  . NO PAST SURGERIES      Family History  Problem Relation Age of Onset  . Hypertension Other    Social History:  reports that he has been smoking cigarettes. He has been smoking about 1.00 pack per day. He has never used smokeless tobacco. He reports that he does not drink alcohol and does not use drugs.  Allergies: No Known Allergies  Medications:                                                                                                                        I reviewed home medications   ROS:  14 systems reviewed and negative except above     Examination:                                                                                                      General: Appears well-developed and well-nourished.  Psych: Affect appropriate to situation Eyes: No scleral injection HENT: No OP obstrucion Head: Normocephalic.  Cardiovascular: Normal rate and regular rhythm.  Respiratory: Effort normal and breath sounds normal to anterior ascultation GI: Soft.  No distension. There is no tenderness.  Skin: WDI    Neurological Examination Mental Status: Alert, oriented, thought content appropriate.  Speech fluent without evidence of aphasia. Able to follow 3 step commands without difficulty. Cranial Nerves: II: Visual fields grossly normal,  III,IV, VI: ptosis not present, extra-ocular motions intact bilaterally, pupils equal, round, reactive to light and accommodation V,VII: smile symmetric, facial light touch sensation normal bilaterally VIII: hearing normal bilaterally IX,X: uvula rises symmetrically XI: bilateral shoulder shrug XII: midline tongue extension Motor: Right : Upper extremity   5/5    Left:     Upper extremity   5/5  Lower extremity   5/5     Lower extremity   5/5 Tone and bulk:normal tone throughout; no atrophy noted Sensory: Impaired vibration proprioception sense in bilateral feet, slightly reduced leg stance or bilateral feet right greater than left Deep Tendon Reflexes:  symmetric throughout Plantars: Right: downgoing   Left: downgoing Cerebellar: normal finger-to-nose, impaired heel-to-shin on bilateral lower extremities Gait: Ataxic gait with positive sign for Romberg's     Lab Results: Basic Metabolic Panel: Recent Labs  Lab 07/27/20 0739 07/27/20 1639 07/28/20 0346 07/29/20 0537  NA 136  --  135 132*  K 3.1*  --  3.9 3.8  CL 101  --  103 101  CO2 24  --  28 24  GLUCOSE 110*  --  96 93  BUN 10  --  10 15  CREATININE 0.87  --  0.83 0.95  CALCIUM 9.3  --  8.6* 8.8*  MG  --  2.0  --   --      CBC: Recent Labs  Lab 07/27/20 0739 07/28/20 0346 07/29/20 0537  WBC 10.0 8.1 7.1  HGB 17.6* 15.7 16.6  HCT 47.3 43.5 RESULTS UNAVAILABLE DUE TO INTERFERING SUBSTANCE  MCV 104.2* 106.1* RESULTS UNAVAILABLE DUE TO INTERFERING SUBSTANCE  PLT 231 191 206    Coagulation Studies: No results for input(s): LABPROT, INR in the last 72 hours.  Imaging: DG Chest 2 View  Result Date: 07/27/2020 CLINICAL DATA:  Syncope EXAM: CHEST - 2 VIEW COMPARISON:  05/21/2017 FINDINGS: Bullous change again noted within the right apex. No superimposed focal pulmonary infiltrate. Lungs are are symmetrically well expanded. No pneumothorax or pleural effusion. Cardiac size within normal limits. Pulmonary vascularity normal. No acute bone abnormality. IMPRESSION: No acute cardiopulmonary disease. Electronically Signed   By: Fidela Salisbury MD   On: 07/27/2020 17:42   CT Head Wo Contrast  Result Date: 07/27/2020 CLINICAL DATA:  Fall, head injury EXAM: CT HEAD WITHOUT CONTRAST CT CERVICAL SPINE WITHOUT  CONTRAST TECHNIQUE: Multidetector CT imaging of the head and cervical spine was performed following the standard protocol without intravenous contrast. Multiplanar CT image reconstructions of the cervical spine were also generated. COMPARISON:  None. FINDINGS: CT HEAD FINDINGS Brain: Normal anatomic configuration. No abnormal intra or extra-axial mass lesion or fluid collection. No abnormal mass effect or midline shift. No evidence of acute intracranial hemorrhage or infarct. Ventricular size is normal. Cerebellum unremarkable. Vascular: Unremarkable Skull: Intact Sinuses/Orbits: There is moderate mucosal thickening within the frontal sinuses bilaterally without associated air-fluid levels. Remaining paranasal sinuses are clear. Orbits are unremarkable. Other: Mastoid air cells and middle ear cavities are clear. There is mild soft tissue swelling superficial to the right supraorbital ridge. There is mild soft tissue  infiltration in keeping with edema or subcutaneous hemorrhage involving the soft tissues of the right temporal fossa. CT CERVICAL SPINE FINDINGS Alignment: Normal cervical lordosis. No listhesis of the cervical spine. Skull base and vertebrae: The craniocervical junction is unremarkable. The atlantodental interval is normal. There is no acute cervical spine fracture. No lytic or blastic bone lesion is identified. Soft tissues and spinal canal: No prevertebral fluid or swelling. No visible canal hematoma. Disc levels: Review of the sagittal images demonstrates intervertebral disc space narrowing and endplate remodeling at U7-2 and C6-7 in keeping with changes of moderate degenerative disc disease res. Small posteriorly oriented disc osteophytes are noted at this level. Remaining intervertebral disc heights and vertebral body heights are preserved. Review of the axial images demonstrates: C1-2: Unremarkable C2-3: Unremarkable C3-4: Mild bilateral uncovertebral disease. No significant facet arthrosis. Mild resultant bilateral neural foraminal narrowing, right greater than left. No significant canal stenosis. C4-5: Unremarkable. C5-6: Severe left uncovertebral arthrosis results in moderate left neural foraminal narrowing. Minimal right uncovertebral and facet arthrosis. No significant neural foraminal narrowing on the right. No significant canal stenosis. C6-7: Moderate bilateral uncovertebral arthrosis. Mild right and mild-to-moderate left neural foraminal narrowing. No significant canal stenosis. C7-T1: Severe right facet arthrosis. No significant neural foraminal narrowing. No significant canal stenosis. Upper chest: Right apical bullous change noted. Other: None significant IMPRESSION: 1. No acute intracranial abnormality. 2. No acute cervical spine fracture. 3. Moderate degenerative disc disease at C5-6 and C6-7. 4. Moderate left neural foraminal narrowing at C5-6 due to uncovertebral arthrosis. 5. Mild soft tissue  swelling superficial to the right supraorbital ridge and the soft tissues of the right temporal fossa. Electronically Signed   By: Fidela Salisbury MD   On: 07/27/2020 17:54   CT Cervical Spine Wo Contrast  Result Date: 07/27/2020 CLINICAL DATA:  Fall, head injury EXAM: CT HEAD WITHOUT CONTRAST CT CERVICAL SPINE WITHOUT CONTRAST TECHNIQUE: Multidetector CT imaging of the head and cervical spine was performed following the standard protocol without intravenous contrast. Multiplanar CT image reconstructions of the cervical spine were also generated. COMPARISON:  None. FINDINGS: CT HEAD FINDINGS Brain: Normal anatomic configuration. No abnormal intra or extra-axial mass lesion or fluid collection. No abnormal mass effect or midline shift. No evidence of acute intracranial hemorrhage or infarct. Ventricular size is normal. Cerebellum unremarkable. Vascular: Unremarkable Skull: Intact Sinuses/Orbits: There is moderate mucosal thickening within the frontal sinuses bilaterally without associated air-fluid levels. Remaining paranasal sinuses are clear. Orbits are unremarkable. Other: Mastoid air cells and middle ear cavities are clear. There is mild soft tissue swelling superficial to the right supraorbital ridge. There is mild soft tissue infiltration in keeping with edema or subcutaneous hemorrhage involving the soft tissues of the right temporal fossa. CT CERVICAL SPINE FINDINGS Alignment:  Normal cervical lordosis. No listhesis of the cervical spine. Skull base and vertebrae: The craniocervical junction is unremarkable. The atlantodental interval is normal. There is no acute cervical spine fracture. No lytic or blastic bone lesion is identified. Soft tissues and spinal canal: No prevertebral fluid or swelling. No visible canal hematoma. Disc levels: Review of the sagittal images demonstrates intervertebral disc space narrowing and endplate remodeling at T5-3 and C6-7 in keeping with changes of moderate degenerative  disc disease res. Small posteriorly oriented disc osteophytes are noted at this level. Remaining intervertebral disc heights and vertebral body heights are preserved. Review of the axial images demonstrates: C1-2: Unremarkable C2-3: Unremarkable C3-4: Mild bilateral uncovertebral disease. No significant facet arthrosis. Mild resultant bilateral neural foraminal narrowing, right greater than left. No significant canal stenosis. C4-5: Unremarkable. C5-6: Severe left uncovertebral arthrosis results in moderate left neural foraminal narrowing. Minimal right uncovertebral and facet arthrosis. No significant neural foraminal narrowing on the right. No significant canal stenosis. C6-7: Moderate bilateral uncovertebral arthrosis. Mild right and mild-to-moderate left neural foraminal narrowing. No significant canal stenosis. C7-T1: Severe right facet arthrosis. No significant neural foraminal narrowing. No significant canal stenosis. Upper chest: Right apical bullous change noted. Other: None significant IMPRESSION: 1. No acute intracranial abnormality. 2. No acute cervical spine fracture. 3. Moderate degenerative disc disease at C5-6 and C6-7. 4. Moderate left neural foraminal narrowing at C5-6 due to uncovertebral arthrosis. 5. Mild soft tissue swelling superficial to the right supraorbital ridge and the soft tissues of the right temporal fossa. Electronically Signed   By: Fidela Salisbury MD   On: 07/27/2020 17:54   MR BRAIN WO CONTRAST  Result Date: 07/27/2020 CLINICAL DATA:  Neuro deficit, acute, stroke suspected. Additional history provided: Patient reports right-sided weakness, tunnel vision, stumbling, muscle weakness for 2 months. EXAM: MRI HEAD WITHOUT CONTRAST TECHNIQUE: Multiplanar, multiecho pulse sequences of the brain and surrounding structures were obtained without intravenous contrast. COMPARISON:  Head CT performed earlier the same day 07/27/2020. FINDINGS: Brain: The examination is intermittently motion  degraded. Most notably, there is moderate/severe motion degradation of the sagittal T1 weighted sequence, moderate motion degradation of the axial T2/FLAIR sequence and mild-to-moderate motion degradation of the axial T1 weighted sequence. Mild generalized parenchymal atrophy. 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 Minimal background scattered T2/FLAIR hyperintensity within the cerebral white matter is nonspecific, but consistent with chronic small vessel ischemic disease. Tiny chronic infarct within the right cerebellar hemisphere. There is no acute infarct. No evidence of intracranial mass. No chronic intracranial blood products. No extra-axial fluid collection. No midline shift. Vascular: Expected proximal arterial flow voids. Skull and upper cervical spine: No focal marrow lesion is identified within the limitations of motion degradation. Sinuses/Orbits: Visualized orbits show no acute finding. Mild diffuse paranasal sinus mucosal thickening. Trace fluid within right mastoid air cells. IMPRESSION: Intermittently motion degraded examination as described. No evidence of acute intracranial abnormality, including acute infarction. 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 Background mild generalized parenchymal atrophy and cerebral white matter chronic small vessel ischemic disease. Mild paranasal sinus mucosal thickening. Trace right mastoid effusion. Electronically Signed   By: Kellie Simmering DO   On: 07/27/2020 20:05   MR Cervical Spine Wo Contrast  Result Date: 07/27/2020 CLINICAL DATA:  Ataxia, cervical spine trauma EXAM: MRI CERVICAL SPINE WITHOUT CONTRAST TECHNIQUE: Multiplanar, multisequence MR imaging of the cervical spine was performed. No intravenous contrast was administered. COMPARISON:  CT same day FINDINGS: Alignment:  Physiologic Vertebrae: The vertebral body heights are well maintained.  Increased marrow signal seen at the endplates of J2-E2 and A8-T4. Cord: There is question of minimally increased STIR signal seen within the central cord at C5-C6 and C6-C7. No cord expansion however is noted. Posterior Fossa, vertebral arteries, paraspinal tissues: The visualized portion of the posterior fossa is unremarkable. Normal flow voids seen within the vertebral arteries. The paraspinal soft tissues are unremarkable. Disc levels: C1-C2: Atlanto-axial junction is normal, without canal narrowing C2-C3: No significant spinal canal or neural foraminal narrowing C3-C4: There is a disc osteophyte complex and uncovertebral osteophytes which causes severe neural foraminal narrowing. C4-C5: There is a disc osteophyte complex and uncovertebral osteophytes which causes moderate bilateral neural foraminal narrowing. C5-C6: There is a disc osteophyte complex and a left lateral recess disc protrusion which contacts and impinges the left C6 nerve root. There is severe left and moderate right neural foraminal narrowing. Mild central canal stenosis is noted. C6-C7: There is a disc osteophyte complex and uncovertebral osteophytes with a right far lateral disc protrusion which contacts and impinges the right C7 nerve root. There is severe bilateral neural foraminal narrowing and mild to moderate central canal stenosis. C7-T1: No significant spinal canal or neural foraminal narrowing IMPRESSION: 1. 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 2. Cervical spine spondylosis most notable C5-C6 with a left lateral recess disc protrusion contacting and impinging the left C6 nerve root with severe left neural foraminal narrowing and mild central canal stenosis. 3. Also at C6-C7 with a right far lateral disc protrusion contacting and pinching the right C7 nerve root with severe bilateral neural foraminal narrowing and mild to moderate central canal stenosis. Electronically Signed   By: Prudencio Pair  M.D.   On: 07/27/2020 23:00   DG Pelvis Portable  Result Date: 07/27/2020 CLINICAL DATA:  Pelvic pain, fall EXAM: PORTABLE PELVIS 1-2 VIEWS COMPARISON:  CT 05/21/2017 FINDINGS: There is no evidence of pelvic fracture or diastasis. No pelvic bone lesions are seen. IMPRESSION: Negative. Electronically Signed   By: Donavan Foil M.D.   On: 07/27/2020 21:06   ECHOCARDIOGRAM COMPLETE  Result Date: 07/28/2020    ECHOCARDIOGRAM REPORT   Patient Name:   Jeff Patton Date of Exam: 07/28/2020 Medical Rec #:  [id number]        Height:       70.0 in Accession #:    [id number]       Weight:       200.0 lb Date of Birth:  December 02, 1965       BSA:          2.087 m Patient Age:    64 years         BP:           183/110 mmHg Patient Gender: M                HR:           70 bpm. Exam Location:  [facility] Procedure: 2D Echo, Color Doppler, Cardiac Doppler and Intracardiac            Opacification Agent Indications:     R55 Syncope  History:         Patient has no prior history of Echocardiogram examinations.                  Stroke; Risk Factors:Hypertension. Hx of blood clot.  Sonographer:     Charmayne Sheer RDCS (AE)  Referring Phys:  [id number] Annice Needy ECKSTAT Diagnosing Phys: Bartholome Bill MD  Sonographer Comments: Suboptimal apical window. IMPRESSIONS  1. Left ventricular ejection fraction, by estimation, is 65 to 70%. Left ventricular ejection fraction by PLAX is 71 %. The left ventricle has normal function. The left ventricle has no regional wall motion abnormalities. Left ventricular diastolic parameters were normal.  2. Right ventricular systolic function is normal. The right ventricular size is normal.  3. The mitral valve is grossly normal. No evidence of mitral valve regurgitation.  4. The aortic valve is grossly normal. Aortic valve regurgitation is trivial.  5. Aortic dilatation noted. There is borderline dilatation of the aortic root measuring 37 mm. FINDINGS  Left Ventricle: Left ventricular ejection fraction, by  estimation, is 65 to 70%. Left ventricular ejection fraction by PLAX is 71 %. The left ventricle has normal function. The left ventricle has no regional wall motion abnormalities. Definity contrast agent was given IV to delineate the left ventricular endocardial borders. The left ventricular internal cavity size was normal in size. There is borderline left ventricular hypertrophy. Left ventricular diastolic parameters were normal. Right Ventricle: The right ventricular size is normal. No increase in right ventricular wall thickness. Right ventricular systolic function is normal. Left Atrium: Left atrial size was normal in size. Right Atrium: Right atrial size was normal in size. Pericardium: There is no evidence of pericardial effusion. Mitral Valve: The mitral valve is grossly normal. No evidence of mitral valve regurgitation. MV peak gradient, 1.7 mmHg. The mean mitral valve gradient is 1.0 mmHg. Tricuspid Valve: The tricuspid valve is grossly normal. Tricuspid valve regurgitation is not demonstrated. Aortic Valve: The aortic valve is grossly normal. Aortic valve regurgitation is trivial. Aortic valve mean gradient measures 2.0 mmHg. Aortic valve peak gradient measures 3.8 mmHg. Aortic valve area, by VTI measures 4.03 cm. Pulmonic Valve: The pulmonic valve was not well visualized. Pulmonic valve regurgitation is not visualized. Aorta: Aortic dilatation noted. There is borderline dilatation of the aortic root measuring 37 mm. IAS/Shunts: The interatrial septum was not assessed.  LEFT VENTRICLE PLAX 2D LV EF:         Left            Diastology                ventricular     LV e' lateral:   8.05 cm/s                ejection        LV E/e' lateral: 6.7                fraction by     LV e' medial:    5.55 cm/s                PLAX is 71      LV E/e' medial:  9.7                %. LVIDd:         4.47 cm LVIDs:         2.69 cm LV PW:         1.12 cm LV IVS:        1.03 cm LVOT diam:     2.50 cm LV SV:         74 LV SV  Index:   36 LVOT Area:     4.91 cm  LEFT ATRIUM  Index LA diam:        3.40 cm 1.63 cm/m LA Vol (A2C):   36.2 ml 17.34 ml/m LA Vol (A4C):   26.4 ml 12.65 ml/m LA Biplane Vol: 31.6 ml 15.14 ml/m  AORTIC VALVE                   PULMONIC VALVE AV Area (Vmax):    3.88 cm    PV Vmax:       0.69 m/s AV Area (Vmean):   4.06 cm    PV Vmean:      49.600 cm/s AV Area (VTI):     4.03 cm    PV VTI:        0.130 m AV Vmax:           97.40 cm/s  PV Peak grad:  1.9 mmHg AV Vmean:          63.800 cm/s PV Mean grad:  1.0 mmHg AV VTI:            0.184 m AV Peak Grad:      3.8 mmHg AV Mean Grad:      2.0 mmHg LVOT Vmax:         76.90 cm/s LVOT Vmean:        52.800 cm/s LVOT VTI:          0.151 m LVOT/AV VTI ratio: 0.82  AORTA Ao Root diam: 3.70 cm MITRAL VALVE MV Area (PHT): 4.89 cm    SHUNTS MV Peak grad:  1.7 mmHg    Systemic VTI:  0.15 m MV Mean grad:  1.0 mmHg    Systemic Diam: 2.50 cm MV Vmax:       0.66 m/s MV Vmean:      39.6 cm/s MV Decel Time: 155 msec MV E velocity: 53.80 cm/s MV A velocity: 59.70 cm/s MV E/A ratio:  0.90 Bartholome Bill MD Electronically signed by Bartholome Bill MD Signature Date/Time: 07/28/2020/2:53:18 PM    Final      I have reviewed the above imaging : MRI brain negative for any acute findings.  Shows small vessel infarcts that are chronic.  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  C 67 neuroforaminal narrowing.  No cord compression seen   ASSESSMENT AND PLAN   54 year old male with history of excessive alcohol consumption presents after syncope and recurrent falls.  No Significant ataxia on exam as well as positive Romberg sign.  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  Recommendations Injection cyanocobalamin 1000 MCG x 5days, then once a week into 1 month Check MMA, folic acid, B1, copper, ceruloplasmin, ESR CRP and vitamin E  levels PT OT therapy Will need outpatient EMG nerve conduction studies    Dawud Mays [facility] Pager Number [phone]

## 2020-07-29 NOTE — Progress Notes (Signed)
Physical Therapy Treatment Patient Details Name: Jeff Patton MRN: [id number] DOB: 1965-12-03 Today's Date: 07/29/2020    History of Present Illness Pt is a 54 y.o. male presenting to hospital 8/30 with possible syncopal episode and fall earlier today.  Pt admitted with syncope, ataxia, EtOH use disorder.  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  Per chart review, pt with h/o binge drinking.    PT Comments    Pt resting in bed upon PT arrival; pt reports walking with staff earlier and feeling unsteady.  During therapy session, pt modified independent with bed mobility; min assist with transfers (using RW); and CGA to min assist with ambulation 120 feet with RW (intermittent min assist for balance).  Pt currently requiring increased assist with functional mobility compared to initial therapy evaluation yesterday.  D/t pt's increased assist levels, PT recommendations updated to SNF (care management notified).    Follow Up Recommendations  SNF     Equipment Recommendations  Rolling walker with 5" wheels;3in1 (PT)    Recommendations for Other Services OT consult     Precautions / Restrictions Precautions Precautions: Fall Restrictions Weight Bearing Restrictions: No    Mobility  Bed Mobility Overal bed mobility: Modified Independent             General bed mobility comments: Semi-supine to/from sitting edge of bed; HOB elevated; mild increased effort to perform on own  Transfers Overall transfer level: Needs assistance Equipment used: Rolling walker (2 wheeled) Transfers: Sit to/from Stand Sit to Stand: Min assist         General transfer comment: min assist to initiate stand up to RW; vc's for UE placement  Ambulation/Gait Ambulation/Gait assistance: Min guard;Min assist Gait Distance (Feet): 120 Feet Assistive device: Rolling walker (2 wheeled)   Gait velocity: decreased   General Gait Details:  more narrow BOS; R LE externally rotated with decreased R LE foot clearance; mild decreased stance time R LE; mild ataxia noted R LE; intermittent min assist for balance   Stairs             Wheelchair Mobility    Modified Rankin (Stroke Patients Only)       Balance Overall balance assessment: Needs assistance Sitting-balance support: No upper extremity supported;Feet supported Sitting balance-Leahy Scale: Normal Sitting balance - Comments: steady sitting reaching outside BOS   Standing balance support: Single extremity supported Standing balance-Leahy Scale: Poor Standing balance comment: pt requiring at least single UE support for static standing balance                            Cognition Arousal/Alertness: Awake/alert Behavior During Therapy: WFL for tasks assessed/performed Overall Cognitive Status: Within Functional Limits for tasks assessed                                        Exercises      General Comments   Nursing cleared pt for participation in physical therapy.  Pt agreeable to PT session.      Pertinent Vitals/Pain Pain Assessment: Faces Faces Pain Scale: Hurts a little bit Pain Location: abrasion on R forehead Pain Descriptors / Indicators: Sore;Tender Pain Intervention(s): Limited activity within patient's tolerance;Monitored during session;Repositioned  Vitals (HR and O2 on room air) stable and WFL throughout treatment session.  BP 156/104 after therapy  treatment.    Home Living                      Prior Function            PT Goals (current goals can now be found in the care plan section) Acute Rehab PT Goals Patient Stated Goal: to improve balance and walking PT Goal Formulation: With patient Time For Goal Achievement: 08/11/20 Potential to Achieve Goals: Good Progress towards PT goals: Progressing toward goals    Frequency    Min 2X/week      PT Plan Discharge plan needs to be updated  (Care management notified)    Co-evaluation              AM-PAC PT "6 Clicks" Mobility   Outcome Measure  Help needed turning from your back to your side while in a flat bed without using bedrails?: None Help needed moving from lying on your back to sitting on the side of a flat bed without using bedrails?: None Help needed moving to and from a bed to a chair (including a wheelchair)?: A Little Help needed standing up from a chair using your arms (e.g., wheelchair or bedside chair)?: A Little Help needed to walk in hospital room?: A Little Help needed climbing 3-5 steps with a railing? : A Little 6 Click Score: 20    End of Session Equipment Utilized During Treatment: Gait belt Activity Tolerance: Patient limited by fatigue Patient left: in bed;with call bell/phone within reach;with bed alarm set Nurse Communication: Mobility status;Precautions PT Visit Diagnosis: Unsteadiness on feet (R26.81);Other abnormalities of gait and mobility (R26.89);History of falling (Z91.81)     Time: 9794-8016 PT Time Calculation (min) (ACUTE ONLY): 25 min  Charges:  $Gait Training: 8-22 mins $Therapeutic Activity: 8-22 mins                    Hendricks Limes, PT 07/29/20, 2:31 PM

## 2020-07-30 LAB — CERULOPLASMIN: Ceruloplasmin: 19.7 mg/dL (ref 16.0–31.0)

## 2020-07-30 NOTE — Progress Notes (Signed)
PROGRESS NOTE    Jeff Patton  XFG:[id number] DOB: 1966-04-05 DOA: 07/27/2020 PCP: Patient, No Pcp Per    Subjective:   The patient was seen and examined this morning stable no acute distress no issues overnight.  Patient was seen and evaluate by neurology yesterday, anticipated his ataxia is due to severe B12 deficiency, initiated on high-dose B12 injection daily for total of 5 days he is amendable and agreeable to plan.  Patient stating that he is able to now stand on his own, took few steps thinking that he has been improving  Patient continues to work with PT OT  ----------------------------------------------------------------------------------------------------------------------------------------------------- HPI/Hospital course:  Jeff Patton is a 54 y.o. male with past medical history of hypertension, tobacco abuse, COPD, remote subsegmental PE, prior stroke with residual right hemiparesis and excessive alcohol use presents to the emergency department with after patient had a syncopal event and fall.  Patient has been having difficulty walking for the last couple months with worsening gait imbalance.   ED: Patient was noted to have truncal ataxia and positive Romberg.  CT head as well as MRI brain which did not show any acute findings.   Macrocytosis as well as B12 deficiency at B12 level 175.   TSH was normal.    Patient admitted and started on oral B12 replacement.   Neurology consulted for evaluation of ataxia.   Patient  started on thiamine and Ativan for concern for possible withdrawal   -------------------------------------------------------------------------------------------------------------------------------------------   Assessment & Plan:   Principal Problem:   Ataxia Active Problems:   B12 deficiency   HTN (hypertension)   Weakness   Alcohol abuse  Syncope:  -No further episodes on this admission, remained stable -Unclear etiology,  vasovagal versus cardiogenic versus orthostatic -Monitoring closely, work-up has been negative with exception of B12 deficiency -. CT head/ C-spine shows no acute intracranial findings, no c-spine fracture & mild soft tissue swelling superficial to the right of the supraorbital ridge & the soft tissues of the right temporal fossa -MRI of the head negative for any acute ischemic changes  -We will discontinue IV fluid resuscitation, continue neurochecks -No further episodes   Ataxia /B12 deficiency Neurology consult, CT of the head MRI of the head was reviewed, -Neurology indicated that patient is likely suffering from B12 deficiency, recommended Injection cyanocobalamin 1000 MCG x 5days,  day 2 out of 5  then once a week into 1 month  -Further work-up including MMA, folic acid R67, copper, ceruloplasmin, ESR, C RP, vitamin D level  hx of alcohol abuse. -No signs of withdrawal  No ethanol level done on admission, so ethanol level today is <10. PT/OT  -Continue CIWA protocol, thiamine folate, multivitamin, Alcohol cessation counseling     Elevated BP:  Blood pressure is improved, patient was started on amlodipine on this admission -As needed hydralazine Patient likely need a prescription for amlodipine on discharge -Improved, stable      DVT prophylaxis: lovenox Code Status: full  Family Communication:called pt's daughter, Otho Ket, but no answer and unable to leave a voicemail  Disposition Plan: depends on PT/OT recommended SNF patient has refused  Disposition: Patient is from home Anticipating discharge home on Saturday, 08/01/2020   Consultants:      Procedures:    Antimicrobials:   Subjective: Pt c/o eye pain   Objective: Vitals:   07/30/20 0636 07/30/20 0845 07/30/20 1040 07/30/20 1126  BP: (!) 140/119 (!) 153/108 (!) 136/94 128/88  Pulse: 87   77  Resp:  Temp:    98.1 F (36.7 C)  TempSrc:    Oral  SpO2: 99%   100%  Weight:      Height:         Intake/Output Summary (Last 24 hours) at 07/30/2020 1334 Last data filed at 07/30/2020 1040 Gross per 24 hour  Intake 118 ml  Output 2050 ml  Net -1932 ml   Filed Weights   07/27/20 0736  Weight: 90.7 kg       Physical Exam:   General:  Alert, oriented, cooperative, no distress;   HEENT:  Normocephalic, PERRL, otherwise with in Normal limits   Neuro:  CNII-XII intact. , normal motor and sensation, reflexes intact   Lungs:   Clear to auscultation BL, Respirations unlabored, no wheezes / crackles  Cardio:    S1/S2, RRR, No murmure, No Rubs or Gallops   Abdomen:   Soft, non-tender, bowel sounds active all four quadrants,  no guarding or peritoneal signs.  Muscular skeletal:  Limited exam - in bed, able to move all 2 extremities, Normal strength,  2+ pulses,  symmetric, No pitting edema, severe bilateral lower extremity weakness, able to ambulate more today.  Skin:  Dry, warm to touch, negative for any Rashes, No open wounds  Wounds: Please see nursing documentation                  Data Reviewed: I have personally reviewed following labs and imaging studies  CBC: Recent Labs  Lab 07/27/20 0739 07/28/20 0346 07/29/20 0537  WBC 10.0 8.1 7.1  HGB 17.6* 15.7 16.6  HCT 47.3 43.5 RESULTS UNAVAILABLE DUE TO INTERFERING SUBSTANCE  MCV 104.2* 106.1* RESULTS UNAVAILABLE DUE TO INTERFERING SUBSTANCE  PLT 231 191 130   Basic Metabolic Panel: Recent Labs  Lab 07/27/20 0739 07/27/20 1639 07/28/20 0346 07/29/20 0537  NA 136  --  135 132*  K 3.1*  --  3.9 3.8  CL 101  --  103 101  CO2 24  --  28 24  GLUCOSE 110*  --  96 93  BUN 10  --  10 15  CREATININE 0.87  --  0.83 0.95  CALCIUM 9.3  --  8.6* 8.8*  MG  --  2.0  --   --    GFR: Estimated Creatinine Clearance: 103.1 mL/min (by C-G formula based on SCr of 0.95 mg/dL). Liver Function Tests: Recent Labs  Lab 07/27/20 1639 07/28/20 0346 07/29/20 0537  AST 33 29 31  ALT 37 29 31  ALKPHOS 66 51 55  BILITOT  0.9 1.3* 1.1  PROT 8.1 6.3* 6.5  ALBUMIN 4.1 3.3* 3.4*   No results for input(s): LIPASE, AMYLASE in the last 168 hours. Recent Labs  Lab 07/27/20 1653  AMMONIA 38*   Coagulation Profile: No results for input(s): INR, PROTIME in the last 168 hours. Cardiac Enzymes: No results for input(s): CKTOTAL, CKMB, CKMBINDEX, TROPONINI in the last 168 hours. BNP (last 3 results) No results for input(s): PROBNP in the last 8760 hours. HbA1C: No results for input(s): HGBA1C in the last 72 hours. CBG: No results for input(s): GLUCAP in the last 168 hours. Lipid Profile: No results for input(s): CHOL, HDL, LDLCALC, TRIG, CHOLHDL, LDLDIRECT in the last 72 hours. Thyroid Function Tests: Recent Labs    07/27/20 1653  TSH 1.400   Anemia Panel: Recent Labs    07/29/20 1534  FOLATE 28.0   Sepsis Labs: No results for input(s): PROCALCITON, LATICACIDVEN in the last 168 hours.  Recent Results (  from the past 240 hour(s))  SARS Coronavirus 2 by RT PCR (hospital order, performed in [facility] hospital lab) Nasopharyngeal Nasopharyngeal Swab     Status: None   Collection Time: 07/27/20  4:53 PM   Specimen: Nasopharyngeal Swab  Result Value Ref Range Status   SARS Coronavirus 2 NEGATIVE NEGATIVE Final    Comment: (NOTE) SARS-CoV-2 target nucleic acids are NOT DETECTED.  The SARS-CoV-2 RNA is generally detectable in upper and lower respiratory specimens during the acute phase of infection. The lowest concentration of SARS-CoV-2 viral copies this assay can detect is 250 copies / mL. A negative result does not preclude SARS-CoV-2 infection and should not be used as the sole basis for treatment or other patient management decisions.  A negative result may occur with improper specimen collection / handling, submission of specimen other than nasopharyngeal swab, presence of viral mutation(s) within the areas targeted by this assay, and inadequate number of viral copies (<250 copies / mL). A  negative result must be combined with clinical observations, patient history, and epidemiological information.  Fact Sheet for Patients:   StrictlyIdeas.no  Fact Sheet for Healthcare Providers: BankingDealers.co.za  This test is not yet approved or  cleared by the [place] FDA and has been authorized for detection and/or diagnosis of SARS-CoV-2 by FDA under an Emergency Use Authorization (EUA).  This EUA will remain in effect (meaning this test can be used) for the duration of the COVID-19 declaration under Section 564(b)(1) of the Act, 21 U.S.C. section 360bbb-3(b)(1), unless the authorization is terminated or revoked sooner.  Performed at [facility], [address], [place], [place] [address]          Radiology Studies: No results found.      Scheduled Meds: . amLODipine  10 mg Oral Daily  . capsaicin   Topical BID  . cyanocobalamin  1,000 mcg Intramuscular Daily  . enoxaparin (LOVENOX) injection  40 mg Subcutaneous Q24H  . folic acid  1 mg Oral Daily  . LORazepam  0-4 mg Intravenous Q12H   Or  . LORazepam  0-4 mg Oral Q12H  . multivitamin with minerals  1 tablet Oral Daily  . nicotine  21 mg Transdermal Daily  . pantoprazole  40 mg Oral Daily  . sodium chloride flush  3 mL Intravenous Q12H  . thiamine  100 mg Oral Daily   Or  . thiamine  100 mg Intravenous Daily   Continuous Infusions: . methocarbamol (ROBAXIN) IV       LOS: 3 days    Time spent: 32 mins     Deatra James, MD [facility] Pager [phone]  If 7PM-7AM, please contact night-coverage [email or web address] 07/30/2020, 1:34 PM

## 2020-07-30 NOTE — Progress Notes (Addendum)
Physical Therapy Treatment Patient Details Name: Jeff Patton MRN: [id number] DOB: 01/11/1966 Today's Date: 07/30/2020    History of Present Illness Pt is a 54 y.o. male presenting to hospital 8/30 with possible syncopal episode and fall earlier today.  Pt admitted with syncope, ataxia, EtOH use disorder.  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  Per chart review, pt with h/o binge drinking.    PT Comments    Pt was long sitting in bed finishing his bed bath. He agrees to PT session and is very pleasant throughout session. Was able to exit bed without assistance. Stood with CGA and ambulated 200 ft with RW. Attempted ambulation without AD however pt very unsafe and unsteady. Discussed benefit of continued skilled PT at DC to address deficits with balance, strength, and safe functional mobility. He was in bed with bed alarm set, call bell in reach, and RN aware of pt's abilities. Will trial SPC and stairs next session in prep for DC home.    Follow Up Recommendations  Home health PT     Equipment Recommendations  Rolling walker with 5" wheels;3in1 (PT)    Recommendations for Other Services       Precautions / Restrictions Precautions Precautions: Fall Restrictions Weight Bearing Restrictions: No    Mobility  Bed Mobility Overal bed mobility: Modified Independent                Transfers Overall transfer level: Needs assistance Equipment used: Rolling walker (2 wheeled) Transfers: Sit to/from Stand Sit to Stand: Supervision            Ambulation/Gait Ambulation/Gait assistance: Supervision;Min guard;Min assist Gait Distance (Feet): 200 Feet Assistive device: Rolling walker (2 wheeled);None Gait Pattern/deviations: Step-through pattern Gait velocity: decreased   General Gait Details: Pt ambulated 200 ft with RW without LOB. Continues to have residual deficits on RLE from previous stroke. pt  ambulated 1 trial with RW and 1 trial without AD. pt very unsteady without use of AD. will attempt [facility] next session.       Balance Overall balance assessment: Needs assistance Sitting-balance support: No upper extremity supported;Feet supported Sitting balance-Leahy Scale: Normal Sitting balance - Comments: steady sitting reaching outside BOS   Standing balance support: Bilateral upper extremity supported;During functional activity;No upper extremity supported Standing balance-Leahy Scale: Poor Standing balance comment: poor standing balance without BUE support                            Cognition Arousal/Alertness: Awake/alert Behavior During Therapy: WFL for tasks assessed/performed Overall Cognitive Status: Within Functional Limits for tasks assessed                                 General Comments: Pleasant and cooperative      Exercises      General Comments        Pertinent Vitals/Pain Pain Assessment: No/denies pain Pain Score: 0-No pain Faces Pain Scale: No hurt    Home Living                      Prior Function            PT Goals (current goals can now be found in the care plan section) Acute Rehab PT Goals Patient Stated Goal: To get better so I can return home and  get back to work Progress towards PT goals: Progressing toward goals    Frequency    Min 2X/week      PT Plan Discharge plan needs to be updated    Co-evaluation              AM-PAC PT "6 Clicks" Mobility   Outcome Measure  Help needed turning from your back to your side while in a flat bed without using bedrails?: None Help needed moving from lying on your back to sitting on the side of a flat bed without using bedrails?: None Help needed moving to and from a bed to a chair (including a wheelchair)?: A Little Help needed standing up from a chair using your arms (e.g., wheelchair or bedside chair)?: A Little Help needed to walk in hospital  room?: A Little Help needed climbing 3-5 steps with a railing? : A Little 6 Click Score: 20    End of Session Equipment Utilized During Treatment: Gait belt Activity Tolerance: Patient tolerated treatment well Patient left: in bed;with call bell/phone within reach;with bed alarm set Nurse Communication: Mobility status;Precautions PT Visit Diagnosis: Unsteadiness on feet (R26.81);Other abnormalities of gait and mobility (R26.89);History of falling (Z91.81)     Time: 1448-1856 PT Time Calculation (min) (ACUTE ONLY): 18 min  Charges:  $Gait Training: 8-22 mins                    Jetta Lout PTA 07/30/20, 4:07 PM

## 2020-07-30 NOTE — TOC Progression Note (Signed)
Transition of Care [facility]) - Progression Note    Patient Details  Name: Jeff Patton MRN: [id number] Date of Birth: 05/30/1966  [facility]) CM/SW Contact  Chapman Fitch, RN Phone Number: 07/30/2020, 2:40 PM  Clinical Narrative:    Call placed to patient's room x2 to discuss discharge disposition.  No answer.  I requested for bedside RN to ensure that patient has his phone   Expected Discharge Plan: Home w Home Health Services Barriers to Discharge: Continued Medical Work up  Expected Discharge Plan and Services Expected Discharge Plan: Home w Home Health Services       Living arrangements for the past 2 months: Single Family Home                                       Social Determinants of Health (SDOH) Interventions    Readmission Risk Interventions No flowsheet data found.

## 2020-07-30 NOTE — TOC Progression Note (Signed)
Transition of Care [facility]) - Progression Note    Patient Details  Name: Jeff Patton MRN: [id number] Date of Birth: 1965/12/27  Transition of Care [facility]) CM/SW Contact  Chapman Fitch, RN Phone Number: 07/30/2020, 4:30 PM  Clinical Narrative:    Discussed discharge disposition with patient Patient states he plans to return home at discharge "I have 2 cats, a dog, and a pig I need to take care of"  Discussed recommendations for home health and rolling walker Patient agreeable to RW.  Referral made to [facility] with [facility]  Patient declines home health at this time.  States "i'm not trying to sound like I know everything, but I know what I need to do in order to get my legs working right"  Patient states that he is agreeable to having a PCP appointment made at one of the sliding scale clinics.  TOC to contact [facility] to see if they can accept patient as a new patient   Expected Discharge Plan: Home w Home Health Services Barriers to Discharge: Continued Medical Work up  Expected Discharge Plan and Services Expected Discharge Plan: Home w Home Health Services       Living arrangements for the past 2 months: Single Family Home                                       Social Determinants of Health (SDOH) Interventions    Readmission Risk Interventions No flowsheet data found.

## 2020-07-31 LAB — METHYLMALONIC ACID, SERUM: Methylmalonic Acid, Quantitative: 455 nmol/L — ABNORMAL HIGH (ref 0–378)

## 2020-07-31 LAB — GLUCOSE, CAPILLARY: Glucose-Capillary: 165 mg/dL — ABNORMAL HIGH (ref 70–99)

## 2020-07-31 NOTE — Progress Notes (Signed)
Physical Therapy Treatment Patient Details Name: Jeff Patton MRN: [id number] DOB: 16-Aug-1966 Today's Date: 07/31/2020    History of Present Illness Pt is a 54 y.o. male presenting to hospital 8/30 with possible syncopal episode and fall earlier today.  Pt admitted with syncope, ataxia, EtOH use disorder.  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  Per chart review, pt with h/o binge drinking.    PT Comments    Pt resting in bed upon PT arrival; agreeable to walking.  Pt steady with step through gait pattern ambulating with RW 150 feet.  Trialed single hand hold assist and pt demonstrating step through gait pattern with mild decreased stance time R LE ambulating 230 feet.  Then trialed pt with SPC use and pt able to ambulate 150 feet (improved steadiness with ambulation noted with cane in L hand compared to R hand).  Currently pt appears safest with use of RW during ambulation--pt educated on this.  Will continue to progress pt with functional mobility and progress towards least restrictive assistive device as able.   Follow Up Recommendations  Home health PT     Equipment Recommendations  Rolling walker with 5" wheels;3in1 (PT)    Recommendations for Other Services OT consult     Precautions / Restrictions Precautions Precautions: Fall Restrictions Weight Bearing Restrictions: No    Mobility  Bed Mobility Overal bed mobility: Modified Independent             General bed mobility comments: semi-supine to/from sitting edge of bed without any noted difficulties  Transfers Overall transfer level: Needs assistance Equipment used: None;Rolling walker (2 wheeled) Transfers: Sit to/from Stand           General transfer comment: x3 trials standing with no UE support (pt mildly unsteady initially standing with weight shift towards L side and holding onto bedrail for support)  Ambulation/Gait Ambulation/Gait  assistance: Min guard Gait Distance (Feet):  (150 feet RW; 230 feet hand hold assist; 150 feet with SPC) Assistive device: Rolling walker (2 wheeled);1 person hand held assist;Straight cane   Gait velocity: decreased   General Gait Details: step through gait pattern with RW and steady; then ambulated with step through gait pattern (mild decreased stance time R LE) with hand hold assist; and then ambulated with SPC use (first 40 feet cane in R hand but pt appearing unsteady--switched cane use to L hand and improved steadiness noted with only mild decreased stance time R LE)   Stairs             Wheelchair Mobility    Modified Rankin (Stroke Patients Only)       Balance Overall balance assessment: Needs assistance Sitting-balance support: No upper extremity supported;Feet supported Sitting balance-Leahy Scale: Normal Sitting balance - Comments: steady sitting reaching outside BOS   Standing balance support: No upper extremity supported Standing balance-Leahy Scale: Good Standing balance comment: weight shift towards L side while standing adjusting sheets on bed (CGA provided for safety)                            Cognition Arousal/Alertness: Awake/alert Behavior During Therapy: WFL for tasks assessed/performed Overall Cognitive Status: Within Functional Limits for tasks assessed                                 General Comments: Pleasant  and cooperative      Exercises      General Comments  Pt agreeable to PT session.      Pertinent Vitals/Pain Pain Assessment: 0-10 Faces Pain Scale: Hurts a little bit Pain Location: abrasion on R forehead Pain Descriptors / Indicators: Sore;Tender Pain Intervention(s): Limited activity within patient's tolerance;Monitored during session;Repositioned  Vitals (HR and O2 on room air) stable and WFL throughout treatment session.  BP 145/102 at rest end of session.    Home Living                       Prior Function            PT Goals (current goals can now be found in the care plan section) Acute Rehab PT Goals Patient Stated Goal: To get better so I can return home and get back to work PT Goal Formulation: With patient Time For Goal Achievement: 08/11/20 Potential to Achieve Goals: Good Progress towards PT goals: Progressing toward goals    Frequency    Min 2X/week      PT Plan Current plan remains appropriate    Co-evaluation              AM-PAC PT "6 Clicks" Mobility   Outcome Measure  Help needed turning from your back to your side while in a flat bed without using bedrails?: None Help needed moving from lying on your back to sitting on the side of a flat bed without using bedrails?: None Help needed moving to and from a bed to a chair (including a wheelchair)?: A Little Help needed standing up from a chair using your arms (e.g., wheelchair or bedside chair)?: A Little Help needed to walk in hospital room?: A Little Help needed climbing 3-5 steps with a railing? : A Little 6 Click Score: 20    End of Session Equipment Utilized During Treatment: Gait belt Activity Tolerance: Patient tolerated treatment well Patient left: in bed;with call bell/phone within reach;with bed alarm set Nurse Communication: Mobility status PT Visit Diagnosis: Unsteadiness on feet (R26.81);Other abnormalities of gait and mobility (R26.89);History of falling (Z91.81)     Time: 4166-0630 PT Time Calculation (min) (ACUTE ONLY): 31 min  Charges:  $Gait Training: 23-37 mins                     Hendricks Limes, PT 07/31/20, 4:12 PM

## 2020-07-31 NOTE — Progress Notes (Signed)
OT Cancellation Note  Patient Details Name: Jeff Patton MRN: [id number] DOB: 06/16/1966   Cancelled Treatment:    Reason Eval/Treat Not Completed: Fatigue/lethargy limiting ability to participate;Patient declined, no reason specified. Upon attempt, pt sleeping but wakes easily. Pt politely declines therapy at this time 2/2 fatigue and need for rest. Pt reports he works 2nd shift and has had difficulty with sleep while hospitalized. Pt eager to walk later and reports nursing student is going to take him for a walk later. Will re-attempt at later date/time as appropriate.   Richrd Prime, MPH, MS, OTR/L ascom [phone] 07/31/20, 11:01 AM

## 2020-07-31 NOTE — TOC Transition Note (Signed)
Transition of Care [facility]) - CM/SW Discharge Note   Patient Details  Name: MARSHUN DUVA MRN: [id number] Date of Birth: May 24, 1966  Transition of Care [facility]) CM/SW Contact:  Eilleen Kempf, LCSW Phone Number: 07/31/2020, 2:13 PM   Clinical Narrative:    CSW spoke with patient about establishing care with a pcp. Patient was okay with going to [facility]. CSW called to schedule appointment, due to holiday weekend CSW was unable to schedule today. CSW provided patient with printed pamphlet for location, contact numbers, hours etc. Discussed with patient the importance of calling first thing Tuesday to schedule an apt. Not to wait until something else happens. Patient agreed.     Barriers to Discharge: Continued Medical Work up   Patient Goals and CMS Choice Patient states their goals for this hospitalization and ongoing recovery are:: Get better      Discharge Placement                       Discharge Plan and Services                                     Social Determinants of Health (SDOH) Interventions     Readmission Risk Interventions No flowsheet data found.

## 2020-07-31 NOTE — Progress Notes (Signed)
PROGRESS NOTE    WEST BOOMERSHINE  ECX:[id number] DOB: 06-Feb-1966 DOA: 07/27/2020 PCP: Patient, No Pcp Per    Subjective:  The patient was seen and examined, stable in bed, reporting his weakness is slowly improving, he was able to ambulate with assist yesterday  ----------------------------------------------------------------------------------------------------------------------------------------------------- HPI/Hospital course:  Jeff Patton is a 54 y.o. male with past medical history of hypertension, tobacco abuse, COPD, remote subsegmental PE, prior stroke with residual right hemiparesis and excessive alcohol use presents to the emergency department with after patient had a syncopal event and fall.  Patient has been having difficulty walking for the last couple months with worsening gait imbalance.   ED: Patient was noted to have truncal ataxia and positive Romberg.  CT head as well as MRI brain which did not show any acute findings.   Macrocytosis as well as B12 deficiency at B12 level 175.   TSH was normal.    Patient admitted and started on oral B12 replacement.   Neurology consulted for evaluation of ataxia.   Patient  started on thiamine and Ativan for concern for possible withdrawal   -------------------------------------------------------------------------------------------------------------------------------------------   Assessment & Plan:   Principal Problem:   Ataxia Active Problems:   B12 deficiency   HTN (hypertension)   Weakness   Alcohol abuse  Syncope:  -Stable no further episodes -Unclear etiology, vasovagal versus cardiogenic versus orthostatic -Monitoring closely, work-up has been negative with exception of B12 deficiency -. CT head/ C-spine shows no acute intracranial findings, no c-spine fracture & mild soft tissue swelling superficial to the right of the supraorbital ridge & the soft tissues of the right temporal fossa -MRI of the head  negative for any acute ischemic changes  -We will discontinue IV fluid resuscitation, continue neurochecks -No further episodes   Ataxia /B12 deficiency Neurology consult, CT of the head MRI of the head was reviewed, -Neurology indicated that patient is likely suffering from B12 deficiency, recommended Injection cyanocobalamin 1000 MCG x 5days,  day 3 out of 5  then once a week into 1 month -Remained stable  -Further work-up including MMA, folic acid N18, copper, ceruloplasmin, ESR, C RP, vitamin D level:  Folate 28, ceruloplasmin normal 19.7, CRP normal at 0.6, SCR normal 3, methylmalonic acid elevated 455 B12 level 175    hx of alcohol abuse. -No signs of withdrawal  No ethanol level done on admission, so ethanol level today is <10. PT/OT  -Continue CIWA protocol, thiamine folate, multivitamin, Alcohol cessation counseling     Elevated BP:  Blood pressure is improved, patient was started on amlodipine on this admission -As needed hydralazine Patient likely need a prescription for amlodipine on discharge -Improved, stable      DVT prophylaxis: lovenox Code Status: full  Family Communication:called pt's daughter, Otho Ket, but no answer and unable to leave a voicemail  Disposition Plan: depends on PT/OT recommended SNF patient has refused  Disposition: Patient is from home Anticipating discharge home on Saturday, 08/01/2020   Consultants:      Procedures:    Antimicrobials:   Subjective: Pt c/o eye pain   Objective: Vitals:   07/30/20 1040 07/30/20 1126 07/30/20 2020 07/31/20 0440  BP: (!) 136/94 128/88 127/88 (!) 142/93  Pulse:  77 75 65  Resp:   16 16  Temp:  98.1 F (36.7 C) 98.4 F (36.9 C) 98 F (36.7 C)  TempSrc:  Oral Oral Oral  SpO2:  100% 99% 99%  Weight:      Height:  Intake/Output Summary (Last 24 hours) at 07/31/2020 1308 Last data filed at 07/31/2020 0830 Gross per 24 hour  Intake 670 ml  Output 1200 ml  Net -530 ml   Filed  Weights   07/27/20 0736  Weight: 90.7 kg       Physical Exam:   General:  Alert, oriented, cooperative, no distress;   HEENT:  Normocephalic, PERRL, otherwise with in Normal limits   Neuro:  CNII-XII intact. , normal motor and sensation, reflexes intact   Lungs:   Clear to auscultation BL, Respirations unlabored, no wheezes / crackles  Cardio:    S1/S2, RRR, No murmure, No Rubs or Gallops   Abdomen:   Soft, non-tender, bowel sounds active all four quadrants,  no guarding or peritoneal signs.  Muscular skeletal:  Limited exam - in bed, able to move all 4 extremities, Normal strength,  2+ pulses,  symmetric, No pitting edema--- improving  generalized, lower extremity weakness  Skin:  Dry, warm to touch, negative for any Rashes, No open wounds  Wounds: Please see nursing documentation                  Data Reviewed: I have personally reviewed following labs and imaging studies  CBC: Recent Labs  Lab 07/27/20 0739 07/28/20 0346 07/29/20 0537  WBC 10.0 8.1 7.1  HGB 17.6* 15.7 16.6  HCT 47.3 43.5 RESULTS UNAVAILABLE DUE TO INTERFERING SUBSTANCE  MCV 104.2* 106.1* RESULTS UNAVAILABLE DUE TO INTERFERING SUBSTANCE  PLT 231 191 353   Basic Metabolic Panel: Recent Labs  Lab 07/27/20 0739 07/27/20 1639 07/28/20 0346 07/29/20 0537  NA 136  --  135 132*  K 3.1*  --  3.9 3.8  CL 101  --  103 101  CO2 24  --  28 24  GLUCOSE 110*  --  96 93  BUN 10  --  10 15  CREATININE 0.87  --  0.83 0.95  CALCIUM 9.3  --  8.6* 8.8*  MG  --  2.0  --   --    GFR: Estimated Creatinine Clearance: 103.1 mL/min (by C-G formula based on SCr of 0.95 mg/dL). Liver Function Tests: Recent Labs  Lab 07/27/20 1639 07/28/20 0346 07/29/20 0537  AST 33 29 31  ALT 37 29 31  ALKPHOS 66 51 55  BILITOT 0.9 1.3* 1.1  PROT 8.1 6.3* 6.5  ALBUMIN 4.1 3.3* 3.4*   No results for input(s): LIPASE, AMYLASE in the last 168 hours. Recent Labs  Lab 07/27/20 1653  AMMONIA 38*   Recent Labs     07/29/20 1534  FOLATE 28.0   Sepsis Labs: No results for input(s): PROCALCITON, LATICACIDVEN in the last 168 hours.  Recent Results (from the past 240 hour(s))  SARS Coronavirus 2 by RT PCR (hospital order, performed in [facility] hospital lab) Nasopharyngeal Nasopharyngeal Swab     Status: None   Collection Time: 07/27/20  4:53 PM   Specimen: Nasopharyngeal Swab  Result Value Ref Range Status   SARS Coronavirus 2 NEGATIVE NEGATIVE Final    Comment: (NOTE) SARS-CoV-2 target nucleic acids are NOT DETECTED.  The SARS-CoV-2 RNA is generally detectable in upper and lower respiratory specimens during the acute phase of infection. The lowest concentration of SARS-CoV-2 viral copies this assay can detect is 250 copies / mL. A negative result does not preclude SARS-CoV-2 infection and should not be used as the sole basis for treatment or other patient management decisions.  A negative result may occur with improper specimen collection /  handling, submission of specimen other than nasopharyngeal swab, presence of viral mutation(s) within the areas targeted by this assay, and inadequate number of viral copies (<250 copies / mL). A negative result must be combined with clinical observations, patient history, and epidemiological information.  Fact Sheet for Patients:   StrictlyIdeas.no  Fact Sheet for Healthcare Providers: BankingDealers.co.za  This test is not yet approved or  cleared by the [place] FDA and has been authorized for detection and/or diagnosis of SARS-CoV-2 by FDA under an Emergency Use Authorization (EUA).  This EUA will remain in effect (meaning this test can be used) for the duration of the COVID-19 declaration under Section 564(b)(1) of the Act, 21 U.S.C. section 360bbb-3(b)(1), unless the authorization is terminated or revoked sooner.  Performed at [facility], [address] [place], [place]  [address]          Radiology Studies: No results found.      Scheduled Meds:  amLODipine  10 mg Oral Daily   capsaicin   Topical BID   cyanocobalamin  1,000 mcg Intramuscular Daily   enoxaparin (LOVENOX) injection  40 mg Subcutaneous I71S   folic acid  1 mg Oral Daily   LORazepam  0-4 mg Intravenous Q12H   Or   LORazepam  0-4 mg Oral Q12H   multivitamin with minerals  1 tablet Oral Daily   nicotine  21 mg Transdermal Daily   pantoprazole  40 mg Oral Daily   sodium chloride flush  3 mL Intravenous Q12H   thiamine  100 mg Oral Daily   Or   thiamine  100 mg Intravenous Daily   Continuous Infusions:    LOS: 4 days    Time spent: 32 mins     Deatra James, MD [facility] Pager [phone]  If 7PM-7AM, please contact night-coverage [email or web address] 07/31/2020, 1:08 PM

## 2020-08-01 LAB — VITAMIN E
Vitamin E (Alpha Tocopherol): 9.4 mg/L (ref 7.0–25.1)
Vitamin E(Gamma Tocopherol): 1.6 mg/L (ref 0.5–5.5)

## 2020-08-01 MED ORDER — CLONAZEPAM 1 MG PO TABS: 1.0000 mg | ORAL_TABLET | Freq: Three times a day (TID) | ORAL | Status: DC | PRN

## 2020-08-01 MED ORDER — NICOTINE 21 MG/24HR TD PT24: 21.0000 mg | MEDICATED_PATCH | Freq: Every day | TRANSDERMAL | 0 refills | Status: DC

## 2020-08-01 MED ORDER — AMLODIPINE BESYLATE 10 MG PO TABS: 10.0000 mg | ORAL_TABLET | Freq: Every day | ORAL | 2 refills | Status: DC

## 2020-08-01 MED ORDER — ADULT MULTIVITAMIN W/MINERALS CH: 1.0000 | ORAL_TABLET | Freq: Every day | ORAL | 0 refills | Status: AC

## 2020-08-01 MED ORDER — HYDRALAZINE HCL 20 MG/ML IJ SOLN: 10.0000 mg | INTRAMUSCULAR | Status: DC | PRN

## 2020-08-01 MED ORDER — NICOTINE POLACRILEX 2 MG MT GUM: 2.0000 mg | CHEWING_GUM | OROMUCOSAL | 0 refills | Status: DC | PRN

## 2020-08-01 MED ORDER — VITAMIN B-12 1000 MCG PO TABS: 1000.0000 ug | ORAL_TABLET | Freq: Every day | ORAL | 2 refills | Status: AC

## 2020-08-01 MED ORDER — PANTOPRAZOLE SODIUM 40 MG PO TBEC: 40.0000 mg | DELAYED_RELEASE_TABLET | Freq: Every day | ORAL | 0 refills | Status: DC

## 2020-08-01 NOTE — Progress Notes (Signed)
PROGRESS NOTE    Jeff Patton  DQQ:[id number] DOB: Nov 09, 1966 DOA: 07/27/2020 PCP: Patient, No Pcp Per    Subjective:  The patient was seen and examined this morning, family significant stating he is ambulating more and more now. Hypotensive this a.m., blood pressure 221/139, denies any distress, pleasant-blood pressure for accuracy   ----------------------------------------------------------------------------------------------------------------------------------------------------- HPI/Hospital course:  Jeff Patton is a 54 y.o. male with past medical history of hypertension, tobacco abuse, COPD, remote subsegmental PE, prior stroke with residual right hemiparesis and excessive alcohol use presents to the emergency department with after patient had a syncopal event and fall.  Patient has been having difficulty walking for the last couple months with worsening gait imbalance.   ED: Patient was noted to have truncal ataxia and positive Romberg.  CT head as well as MRI brain which did not show any acute findings.   Macrocytosis as well as B12 deficiency at B12 level 175.   TSH was normal.    Patient admitted and started on oral B12 replacement.   Neurology consulted for evaluation of ataxia.   Patient  started on thiamine and Ativan for concern for possible withdrawal   -------------------------------------------------------------------------------------------------------------------------------------------   Assessment & Plan:   Principal Problem:   Ataxia Active Problems:   B12 deficiency   HTN (hypertension)   Weakness   Alcohol abuse  Syncope:  -No further episodes on this admission -Remained stable -Unclear etiology, vasovagal versus cardiogenic versus orthostatic -Monitoring closely, work-up has been negative with exception of B12 deficiency -. CT head/ C-spine shows no acute intracranial findings, no c-spine fracture & mild soft tissue swelling  superficial to the right of the supraorbital ridge & the soft tissues of the right temporal fossa -MRI of the head negative for any acute ischemic changes  -We will discontinue IV fluid resuscitation, continue neurochecks -No further episodes   Ataxia /B12 deficiency Neurology consult, CT of the head MRI of the head was reviewed, -Neurology indicated that patient is likely suffering from B12 deficiency, recommended Injection cyanocobalamin 1000 MCG x 5days,  day 3 out of 5  then once a week into 1 month -Remained stable.  To be discharged in a.m. to home  -Further work-up including MMA, folic acid J94, copper, ceruloplasmin, ESR, C RP, vitamin D level:  Folate 28, ceruloplasmin normal 19.7, CRP normal at 0.6, SCR normal 3, methylmalonic acid elevated 455 B12 level 175    hx of alcohol abuse. -No signs of withdrawal  No ethanol level done on admission, so ethanol level today is <10. PT/OT  -Continue CIWA protocol, thiamine folate, multivitamin, Alcohol cessation counseling     Elevated BP:  -Elevated BP this a.m. 221/139, reevaluating, rechecking coagulations -Utilizing as needed hydralazine, continue current medication and amlodipine This morning blood pressure has been stable.      DVT prophylaxis: lovenox Code Status: full  Family Communication:called pt's daughter, Otho Ket, but no answer and unable to leave a voicemail  Disposition Plan: depends on PT/OT recommended SNF patient has refused  Disposition: Patient is from home Anticipating discharge home on Saturday, 08/01/2020   Consultants:      Procedures:    Antimicrobials:   Subjective: Pt c/o eye pain   Objective: Vitals:   08/01/20 0432 08/01/20 0628 08/01/20 0631 08/01/20 0632  BP: (!) 136/95 (!) 156/95 (!) 148/119 (!) 221/139  Pulse: 73 63 71 87  Resp: _0 Temp: 98.1 F (36.7 C) 97.6 F (36.4 C) 98 F (36.7 C) 98.5 F (36.9 C)  TempSrc: Oral Oral Oral Oral  SpO2: 99% 98% 100% 95%    Weight:      Height:        Intake/Output Summary (Last 24 hours) at 08/01/2020 1209 Last data filed at 07/31/2020 2138 Gross per 24 hour  Intake --  Output 1250 ml  Net -1250 ml   Filed Weights   07/27/20 0736  Weight: 90.7 kg        Physical Exam:   General:  Alert, oriented, cooperative, no distress;   HEENT:  Normocephalic, PERRL, otherwise with in Normal limits   Neuro:  CNII-XII intact. , normal motor and sensation, reflexes intact   Lungs:   Clear to auscultation BL, Respirations unlabored, no wheezes / crackles  Cardio:    S1/S2, RRR, No murmure, No Rubs or Gallops   Abdomen:   Soft, non-tender, bowel sounds active all four quadrants,  no guarding or peritoneal signs.  Muscular skeletal:  Limited exam - in bed, able to move all 4 extremities, Normal strength,  2+ pulses,  symmetric, No pitting edema  Skin:  Dry, warm to touch, negative for any Rashes, No open wounds  Wounds: Please see nursing documentation                  Data Reviewed: I have personally reviewed following labs and imaging studies  CBC: Recent Labs  Lab 07/27/20 0739 07/28/20 0346 07/29/20 0537  WBC 10.0 8.1 7.1  HGB 17.6* 15.7 16.6  HCT 47.3 43.5 RESULTS UNAVAILABLE DUE TO INTERFERING SUBSTANCE  MCV 104.2* 106.1* RESULTS UNAVAILABLE DUE TO INTERFERING SUBSTANCE  PLT 231 191 528   Basic Metabolic Panel: Recent Labs  Lab 07/27/20 0739 07/27/20 1639 07/28/20 0346 07/29/20 0537  NA 136  --  135 132*  K 3.1*  --  3.9 3.8  CL 101  --  103 101  CO2 24  --  28 24  GLUCOSE 110*  --  96 93  BUN 10  --  10 15  CREATININE 0.87  --  0.83 0.95  CALCIUM 9.3  --  8.6* 8.8*  MG  --  2.0  --   --    GFR: Estimated Creatinine Clearance: 103.1 mL/min (by C-G formula based on SCr of 0.95 mg/dL). Liver Function Tests: Recent Labs  Lab 07/27/20 1639 07/28/20 0346 07/29/20 0537  AST 33 29 31  ALT 37 29 31  ALKPHOS 66 51 55  BILITOT 0.9 1.3* 1.1  PROT 8.1 6.3* 6.5  ALBUMIN 4.1  3.3* 3.4*   No results for input(s): LIPASE, AMYLASE in the last 168 hours. Recent Labs  Lab 07/27/20 1653  AMMONIA 38*   Recent Labs    07/29/20 1534  FOLATE 28.0   Sepsis Labs: No results for input(s): PROCALCITON, LATICACIDVEN in the last 168 hours.  Recent Results (from the past 240 hour(s))  SARS Coronavirus 2 by RT PCR (hospital order, performed in [facility] hospital lab) Nasopharyngeal Nasopharyngeal Swab     Status: None   Collection Time: 07/27/20  4:53 PM   Specimen: Nasopharyngeal Swab  Result Value Ref Range Status   SARS Coronavirus 2 NEGATIVE NEGATIVE Final    Comment: (NOTE) SARS-CoV-2 target nucleic acids are NOT DETECTED.  The SARS-CoV-2 RNA is generally detectable in upper and lower respiratory specimens during the acute phase of infection. The lowest concentration of SARS-CoV-2 viral copies this assay can detect is 250 copies / mL. A negative result does not preclude SARS-CoV-2 infection and should not be used  as the sole basis for treatment or other patient management decisions.  A negative result may occur with improper specimen collection / handling, submission of specimen other than nasopharyngeal swab, presence of viral mutation(s) within the areas targeted by this assay, and inadequate number of viral copies (<250 copies / mL). A negative result must be combined with clinical observations, patient history, and epidemiological information.  Fact Sheet for Patients:   StrictlyIdeas.no  Fact Sheet for Healthcare Providers: BankingDealers.co.za  This test is not yet approved or  cleared by the [place] FDA and has been authorized for detection and/or diagnosis of SARS-CoV-2 by FDA under an Emergency Use Authorization (EUA).  This EUA will remain in effect (meaning this test can be used) for the duration of the COVID-19 declaration under Section 564(b)(1) of the Act, 21 U.S.C. section  360bbb-3(b)(1), unless the authorization is terminated or revoked sooner.  Performed at [facility], [address], [place], [place] [address]          Radiology Studies: No results found.      Scheduled Meds: . amLODipine  10 mg Oral Daily  . capsaicin   Topical BID  . cyanocobalamin  1,000 mcg Intramuscular Daily  . enoxaparin (LOVENOX) injection  40 mg Subcutaneous Q24H  . folic acid  1 mg Oral Daily  . multivitamin with minerals  1 tablet Oral Daily  . nicotine  21 mg Transdermal Daily  . pantoprazole  40 mg Oral Daily  . sodium chloride flush  3 mL Intravenous Q12H  . thiamine  100 mg Oral Daily   Or  . thiamine  100 mg Intravenous Daily   Continuous Infusions:    LOS: 5 days    Time spent: 32 mins     Deatra James, MD [facility] Pager [phone]  If 7PM-7AM, please contact night-coverage [email or web address] 08/01/2020, 12:09 PM

## 2020-08-01 NOTE — Progress Notes (Signed)
Occupational Therapy Treatment Patient Details Name: Jeff Patton MRN: [id number] DOB: 1966-03-02 Today's Date: 08/01/2020    History of present illness Pt is a 54 y.o. male presenting to hospital 8/30 with possible syncopal episode and fall earlier today.  Pt admitted with syncope, ataxia, EtOH use disorder.  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  Per chart review, pt with h/o binge drinking.   OT comments  Mr Scally was seen for OT treatment on this date. Upon arrival to room pt awake, reclined in bed, agreeable to tx. Pt currently MOD I to exit R side of bed and don B socks seated EOB. SBA + RW for ~300 ft hallway mobility to simulate mobility required for his job. Pt completed THEREX: 2 sets x 30" each single leg stance, 1 set x 10 reps each lateral trunk crunch seated EOB. Pt making good progress toward goals. Pt continues to benefit from skilled OT services to maximize return to PLOF and minimize risk of future falls, injury, caregiver burden, and readmission. Will continue to follow POC. Discharge recommendation remains appropriate.    Follow Up Recommendations  Home health OT    Equipment Recommendations  None recommended by OT    Recommendations for Other Services      Precautions / Restrictions Precautions Precautions: Fall Restrictions Weight Bearing Restrictions: No       Mobility Bed Mobility Overal bed mobility: Modified Independent     Transfers Overall transfer level: Needs assistance Equipment used: Rolling walker (2 wheeled) Transfers: Sit to/from Stand Sit to Stand: Supervision       Balance Overall balance assessment: Needs assistance Sitting-balance support: No upper extremity supported;Feet supported Sitting balance-Leahy Scale: Normal Sitting balance - Comments: steady sitting reaching outside BOS   Standing balance support: No upper extremity supported Standing balance-Leahy Scale:  Good        ADL either performed or assessed with clinical judgement   ADL Overall ADL's : Needs assistance/impaired      General ADL Comments: SBA + RW for ADL t/f. MOD I don B socks seated EOB.       Cognition Arousal/Alertness: Awake/alert Behavior During Therapy: WFL for tasks assessed/performed Overall Cognitive Status: Within Functional Limits for tasks assessed      General Comments: Pleasant and cooperative        Exercises Exercises: Other exercises Other Exercises Other Exercises: Pt educate re: falls prevention, home/routines modifications, ECS, HEP Other Exercises: LBD, ~300 ft mobility, sup<>sit, sit<>stand, sitting/standing balance/tolerance. THEREX: 2 sets x 30" each single leg stance, 1 set x 10 reps each lateral trunk crunch seated EOB     Pertinent Vitals/ Pain       Pain Assessment: No/denies pain   Frequency  Min 1X/week        Progress Toward Goals  OT Goals(current goals can now be found in the care plan section)  Progress towards OT goals: Progressing toward goals  Acute Rehab OT Goals Patient Stated Goal: To get better so I can return home and get back to work OT Goal Formulation: With patient Time For Goal Achievement: 08/11/20 Potential to Achieve Goals: Good ADL Goals Pt Will Perform Grooming: with modified independence;standing (c LRAD PRN) Pt Will Perform Lower Body Dressing: with modified independence;sit to/from stand (c LRAD PRN) Pt Will Transfer to Toilet: with modified independence;ambulating;regular height toilet (c LRAD PRN) Additional ADL Goal #1: Pt will Independently verbalize plan to implement x3 falls prevention strategies.  Plan Discharge plan remains appropriate;Frequency remains appropriate    AM-PAC OT "6 Clicks" Daily Activity     Outcome Measure   Help from another person eating meals?: None Help from another person taking care of personal grooming?: None Help from another person toileting, which includes  using toliet, bedpan, or urinal?: A Little Help from another person bathing (including washing, rinsing, drying)?: A Little Help from another person to put on and taking off regular upper body clothing?: None Help from another person to put on and taking off regular lower body clothing?: A Little 6 Click Score: 21    End of Session Equipment Utilized During Treatment: Rolling walker  OT Visit Diagnosis: Other abnormalities of gait and mobility (R26.89)   Activity Tolerance Patient tolerated treatment well   Patient Left in bed;with call bell/phone within reach   Nurse Communication Mobility status        Time: 1431-1454 OT Time Calculation (min): 23 min  Charges: OT General Charges $OT Visit: 1 Visit OT Treatments $Self Care/Home Management : 8-22 mins $Therapeutic Exercise: 8-22 mins  Kathie Dike, M.S. OTR/L  08/01/20, 3:15 PM  ascom [phone]

## 2020-08-01 NOTE — Progress Notes (Signed)
Reason for consult: Ataxia   Subjective: Patient's gait has improved since admission.  Continues to receive injection B12.   ROS: negative except above  Examination  Vital signs in last 24 hours: Temp:  [97.6 F (36.4 C)-98.5 F (36.9 C)] 98.5 F (36.9 C) (09/04 1292) Pulse Rate:  [63-90] 87 (09/04 0632) Resp:  [16-20] 20 (09/04 9090) BP: (132-221)/(91-139) 221/139 (09/04 3014) SpO2:  [95 %-100 %] 95 % (09/04 9969)  General: lying in bed CVS: pulse-normal rate and rhythm RS: breathing comfortably Extremities: normal   Neuro: MS: Alert, oriented, follows commands CN: pupils equal and reactive,  EOMI, face symmetric, tongue midline, normal sensation over face, Motor: 5/5 strength in all 4 extremities Reflexes: 2+ bilaterally over patella, biceps, plantars: flexor Sensory: Impaired proprioception and vibration in bilateral great toes Coordination: Ataxic gait, Romberg's present Gait: not tested  Basic Metabolic Panel: Recent Labs  Lab 07/27/20 0739 07/27/20 1639 07/28/20 0346 07/29/20 0537  NA 136  --  135 132*  K 3.1*  --  3.9 3.8  CL 101  --  103 101  CO2 24  --  28 24  GLUCOSE 110*  --  96 93  BUN 10  --  10 15  CREATININE 0.87  --  0.83 0.95  CALCIUM 9.3  --  8.6* 8.8*  MG  --  2.0  --   --     CBC: Recent Labs  Lab 07/27/20 0739 07/28/20 0346 07/29/20 0537  WBC 10.0 8.1 7.1  HGB 17.6* 15.7 16.6  HCT 47.3 43.5 RESULTS UNAVAILABLE DUE TO INTERFERING SUBSTANCE  MCV 104.2* 106.1* RESULTS UNAVAILABLE DUE TO INTERFERING SUBSTANCE  PLT 231 191 206     Coagulation Studies: No results for input(s): LABPROT, INR in the last 72 hours.  Imaging Reviewed:     ASSESSMENT AND PLAN  54 year old male with history of excessive alcohol consumption presents after syncope and recurrent falls.  No Significant ataxia on exam as well as positive Romberg sign.  Positive Romberg sign indicates neuropathy affecting proprioception and vibration-likely due to  patient's vitamin B12 deficiency.  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  Gait improving with B12 injection.   Recommendations Injection cyanocobalamin 1000 MCG x 5days, then once a week into 1 month MMA elevated favors B12 deficiency, copper level normal, ESR CRP normal, folic acid normal Will need outpatient EMG nerve conduction studies Continue PT OT  Outpatient neurology follow-up 2- 4 weeks    [facility] Pager Number [phone] For questions after 7pm please refer to AMION to reach the Neurologist on call

## 2020-08-02 ENCOUNTER — Encounter: Payer: Self-pay | Admitting: Family Medicine

## 2020-08-02 MED ORDER — CYANOCOBALAMIN 1000 MCG/ML IJ SOLN: 1000.0000 ug | INTRAMUSCULAR | 0 refills | Status: AC

## 2020-08-02 NOTE — TOC Transition Note (Signed)
Transition of Care [facility]) - CM/SW Discharge Note   Patient Details  Name: Jeff Patton MRN: [id number] Date of Birth: 1966/01/06  Transition of Care [facility]) CM/SW Contact:  Meriel Flavors, LCSW Phone Number: 08/02/2020, 10:23 AM   Clinical Narrative:    CSW met with patient and provided PCP resources with explanation of when to call to get an appointment ASAP. Reiterated the importance of establishing with a physician and needing to actively participate in maintaining health        Barriers to Discharge: Continued Medical Work up   Patient Goals and CMS Choice Patient states their goals for this hospitalization and ongoing recovery are:: Get better      Discharge Placement                       Discharge Plan and Services                                     Social Determinants of Health (SDOH) Interventions     Readmission Risk Interventions No flowsheet data found.

## 2020-08-02 NOTE — Discharge Summary (Signed)
Physician Discharge Summary [facility]    Patient: Jeff Patton                   Admit date: 07/27/2020   DOB: 11-Dec-1965             Discharge date:08/02/2020/7:28 AM BJY:[id number]                          PCP: Patient, No Pcp Per  Disposition: Home   Recommendations for Outpatient Follow-up:   . Follow up: in 1 week  Discharge Condition: Stable   Code Status:   Code Status: Full Code  Diet recommendation: Cardiac diet   Discharge Diagnoses:    Principal Problem:   Ataxia Active Problems:   B12 deficiency   HTN (hypertension)   Weakness   Alcohol abuse   History of Present Illness/ Hospital Course Charline Bills Summary:   HPI/Hospital course:  Jeff Patton Cottleis a 54 y.o.malewith past medical history of hypertension, tobacco abuse, COPD, remote subsegmental PE, prior stroke with residual right hemiparesis and excessive alcohol use presents to the emergency department with after patient had a syncopal event and fall.  Patient has been having difficulty walking for the last couple months with worsening gait imbalance.   ED: Patient was noted to have truncal ataxia and positive Romberg.  CT head as well as MRI brain which did not show any acute findings.  Macrocytosis as well as B12 deficiency at B12 level 175.  TSH was normal.   Patient admitted and started on oral B12 replacement.  Neurology consulted for evaluation of ataxia. Patient  started on thiamine and Ativan for concern for possible withdrawal   Syncope:  -No further episodes on this admission -Remained stable -Unclear etiology, vasovagal versus cardiogenic versus orthostatic -Monitoring closely, work-up has been negative with exception of B12 deficiency -. CT head/ C-spine shows no acute intracranial findings, no c-spine fracture & mild soft tissue swelling superficial to the right of the supraorbital ridge & the soft tissues of the right temporal fossa -MRI of the head  negative for any acute ischemic changes  -We will discontinue IV fluid resuscitation, continue neurochecks -No further episodes   Ataxia /B12 deficiency Neurology consult, CT of the head MRI of the head was reviewed, -Neurology indicated that patient is likely suffering from B12 deficiency, recommended Injection cyanocobalamin1000MCGx 5days, day 3 out of 5  then once a week into 1 month -Remained stable.  To be discharged in a.m. to home   Folate 28, ceruloplasmin normal 19.7, CRP normal at 0.6, SCR normal 3, methylmalonic acid elevated 455 B12 level 175  S/p Injection cyanocobalamin1000MCGx 5days,NEEDS once a week into 1 month Will need outpatient EMG nerve conduction studies  hx of alcohol abuse. -No signs of withdrawal  No ethanol level done on admission, so ethanol level today is <10. PT/OT  -Continue CIWA protocol, thiamine folate, multivitamin, Alcohol cessation counseling     Elevated BP:  -Elevated BP this a.m. 221/139, reevaluating, rechecking coagulations -Utilizing as needed hydralazine, continue current medication and amlodipine This morning blood pressure has been stable.      DVT prophylaxis: lovenox Code Status: full  Family Communication:called pt's daughter, Jeanie Cooks, but no answer and unable to leave a voicemail  Disposition Plan: depends on PT/OT recommended SNF patient has refused  Disposition: Patient is from home  Nutritional status:          Discharge Instructions:   Discharge Instructions  Activity as tolerated - No restrictions   Complete by: As directed    Call MD for:  difficulty breathing, headache or visual disturbances   Complete by: As directed    Call MD for:  persistant dizziness or light-headedness   Complete by: As directed    Call MD for:  temperature >100.4   Complete by: As directed    Diet - low sodium heart healthy   Complete by: As directed    Discharge instructions   Complete by: As directed     Follow with PCP and neurologist in 1-2 weeks Likely need to be set up for monthly injection of B12   Discharge instructions   Complete by: As directed    S/p Injection cyanocobalamin1000MCGx 5days ending 08/02/2020.  NEEDS once a week into 1 month. Will need outpatient EMG nerve conduction studies   Increase activity slowly   Complete by: As directed        Medication List    STOP taking these medications   apixaban 5 MG Tabs tablet Commonly known as: ELIQUIS     TAKE these medications   amLODipine 10 MG tablet Commonly known as: NORVASC Take 1 tablet (10 mg total) by mouth daily.   multivitamin with minerals Tabs tablet Take 1 tablet by mouth daily.   nicotine 21 mg/24hr patch Commonly known as: NICODERM CQ - dosed in mg/24 hours Place 1 patch (21 mg total) onto the skin daily.   nicotine polacrilex 2 MG gum Commonly known as: NICORETTE Take 1 each (2 mg total) by mouth as needed for smoking cessation.   pantoprazole 40 MG tablet Commonly known as: PROTONIX Take 1 tablet (40 mg total) by mouth daily.   vitamin B-12 1000 MCG tablet Commonly known as: CYANOCOBALAMIN Take 1 tablet (1,000 mcg total) by mouth daily.   cyanocobalamin 1000 MCG/ML injection Commonly known as: (VITAMIN B-12) Inject 1 mL (1,000 mcg total) into the muscle every 7 (seven) days for 4 doses.       No Known Allergies   Procedures /Studies:   DG Chest 2 View  Result Date: 07/27/2020 CLINICAL DATA:  Syncope EXAM: CHEST - 2 VIEW COMPARISON:  05/21/2017 FINDINGS: Bullous change again noted within the right apex. No superimposed focal pulmonary infiltrate. Lungs are are symmetrically well expanded. No pneumothorax or pleural effusion. Cardiac size within normal limits. Pulmonary vascularity normal. No acute bone abnormality. IMPRESSION: No acute cardiopulmonary disease. Electronically Signed   By: Helyn Numbers MD   On: 07/27/2020 17:42   CT Head Wo Contrast  Result Date:  07/27/2020 CLINICAL DATA:  Fall, head injury EXAM: CT HEAD WITHOUT CONTRAST CT CERVICAL SPINE WITHOUT CONTRAST TECHNIQUE: Multidetector CT imaging of the head and cervical spine was performed following the standard protocol without intravenous contrast. Multiplanar CT image reconstructions of the cervical spine were also generated. COMPARISON:  None. FINDINGS: CT HEAD FINDINGS Brain: Normal anatomic configuration. No abnormal intra or extra-axial mass lesion or fluid collection. No abnormal mass effect or midline shift. No evidence of acute intracranial hemorrhage or infarct. Ventricular size is normal. Cerebellum unremarkable. Vascular: Unremarkable Skull: Intact Sinuses/Orbits: There is moderate mucosal thickening within the frontal sinuses bilaterally without associated air-fluid levels. Remaining paranasal sinuses are clear. Orbits are unremarkable. Other: Mastoid air cells and middle ear cavities are clear. There is mild soft tissue swelling superficial to the right supraorbital ridge. There is mild soft tissue infiltration in keeping with edema or subcutaneous hemorrhage involving the soft tissues of the right temporal fossa.  CT CERVICAL SPINE FINDINGS Alignment: Normal cervical lordosis. No listhesis of the cervical spine. Skull base and vertebrae: The craniocervical junction is unremarkable. The atlantodental interval is normal. There is no acute cervical spine fracture. No lytic or blastic bone lesion is identified. Soft tissues and spinal canal: No prevertebral fluid or swelling. No visible canal hematoma. Disc levels: Review of the sagittal images demonstrates intervertebral disc space narrowing and endplate remodeling at C5-6 and C6-7 in keeping with changes of moderate degenerative disc disease res. Small posteriorly oriented disc osteophytes are noted at this level. Remaining intervertebral disc heights and vertebral body heights are preserved. Review of the axial images demonstrates: C1-2:  Unremarkable C2-3: Unremarkable C3-4: Mild bilateral uncovertebral disease. No significant facet arthrosis. Mild resultant bilateral neural foraminal narrowing, right greater than left. No significant canal stenosis. C4-5: Unremarkable. C5-6: Severe left uncovertebral arthrosis results in moderate left neural foraminal narrowing. Minimal right uncovertebral and facet arthrosis. No significant neural foraminal narrowing on the right. No significant canal stenosis. C6-7: Moderate bilateral uncovertebral arthrosis. Mild right and mild-to-moderate left neural foraminal narrowing. No significant canal stenosis. C7-T1: Severe right facet arthrosis. No significant neural foraminal narrowing. No significant canal stenosis. Upper chest: Right apical bullous change noted. Other: None significant IMPRESSION: 1. No acute intracranial abnormality. 2. No acute cervical spine fracture. 3. Moderate degenerative disc disease at C5-6 and C6-7. 4. Moderate left neural foraminal narrowing at C5-6 due to uncovertebral arthrosis. 5. Mild soft tissue swelling superficial to the right supraorbital ridge and the soft tissues of the right temporal fossa. Electronically Signed   By: Helyn Numbers MD   On: 07/27/2020 17:54   CT Cervical Spine Wo Contrast  Result Date: 07/27/2020 CLINICAL DATA:  Fall, head injury EXAM: CT HEAD WITHOUT CONTRAST CT CERVICAL SPINE WITHOUT CONTRAST TECHNIQUE: Multidetector CT imaging of the head and cervical spine was performed following the standard protocol without intravenous contrast. Multiplanar CT image reconstructions of the cervical spine were also generated. COMPARISON:  None. FINDINGS: CT HEAD FINDINGS Brain: Normal anatomic configuration. No abnormal intra or extra-axial mass lesion or fluid collection. No abnormal mass effect or midline shift. No evidence of acute intracranial hemorrhage or infarct. Ventricular size is normal. Cerebellum unremarkable. Vascular: Unremarkable Skull: Intact  Sinuses/Orbits: There is moderate mucosal thickening within the frontal sinuses bilaterally without associated air-fluid levels. Remaining paranasal sinuses are clear. Orbits are unremarkable. Other: Mastoid air cells and middle ear cavities are clear. There is mild soft tissue swelling superficial to the right supraorbital ridge. There is mild soft tissue infiltration in keeping with edema or subcutaneous hemorrhage involving the soft tissues of the right temporal fossa. CT CERVICAL SPINE FINDINGS Alignment: Normal cervical lordosis. No listhesis of the cervical spine. Skull base and vertebrae: The craniocervical junction is unremarkable. The atlantodental interval is normal. There is no acute cervical spine fracture. No lytic or blastic bone lesion is identified. Soft tissues and spinal canal: No prevertebral fluid or swelling. No visible canal hematoma. Disc levels: Review of the sagittal images demonstrates intervertebral disc space narrowing and endplate remodeling at C5-6 and C6-7 in keeping with changes of moderate degenerative disc disease res. Small posteriorly oriented disc osteophytes are noted at this level. Remaining intervertebral disc heights and vertebral body heights are preserved. Review of the axial images demonstrates: C1-2: Unremarkable C2-3: Unremarkable C3-4: Mild bilateral uncovertebral disease. No significant facet arthrosis. Mild resultant bilateral neural foraminal narrowing, right greater than left. No significant canal stenosis. C4-5: Unremarkable. C5-6: Severe left uncovertebral arthrosis results in moderate  left neural foraminal narrowing. Minimal right uncovertebral and facet arthrosis. No significant neural foraminal narrowing on the right. No significant canal stenosis. C6-7: Moderate bilateral uncovertebral arthrosis. Mild right and mild-to-moderate left neural foraminal narrowing. No significant canal stenosis. C7-T1: Severe right facet arthrosis. No significant neural foraminal  narrowing. No significant canal stenosis. Upper chest: Right apical bullous change noted. Other: None significant IMPRESSION: 1. No acute intracranial abnormality. 2. No acute cervical spine fracture. 3. Moderate degenerative disc disease at C5-6 and C6-7. 4. Moderate left neural foraminal narrowing at C5-6 due to uncovertebral arthrosis. 5. Mild soft tissue swelling superficial to the right supraorbital ridge and the soft tissues of the right temporal fossa. Electronically Signed   By: Helyn Numbers MD   On: 07/27/2020 17:54   MR BRAIN WO CONTRAST  Result Date: 07/27/2020 CLINICAL DATA:  Neuro deficit, acute, stroke suspected. Additional history provided: Patient reports right-sided weakness, tunnel vision, stumbling, muscle weakness for 2 months. EXAM: MRI HEAD WITHOUT CONTRAST TECHNIQUE: Multiplanar, multiecho pulse sequences of the brain and surrounding structures were obtained without intravenous contrast. COMPARISON:  Head CT performed earlier the same day 07/27/2020. FINDINGS: Brain: The examination is intermittently motion degraded. Most notably, there is moderate/severe motion degradation of the sagittal T1 weighted sequence, moderate motion degradation of the axial T2/FLAIR sequence and mild-to-moderate motion degradation of the axial T1 weighted sequence. Mild generalized parenchymal atrophy. 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 Minimal background scattered T2/FLAIR hyperintensity within the cerebral white matter is nonspecific, but consistent with chronic small vessel ischemic disease. Tiny chronic infarct within the right cerebellar hemisphere. There is no acute infarct. No evidence of intracranial mass. No chronic intracranial blood products. No extra-axial fluid collection. No midline shift. Vascular: Expected proximal arterial flow voids. Skull and upper cervical spine: No focal marrow lesion is identified within the limitations of motion degradation.  Sinuses/Orbits: Visualized orbits show no acute finding. Mild diffuse paranasal sinus mucosal thickening. Trace fluid within right mastoid air cells. IMPRESSION: Intermittently motion degraded examination as described. No evidence of acute intracranial abnormality, including acute infarction. 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 Background mild generalized parenchymal atrophy and cerebral white matter chronic small vessel ischemic disease. Mild paranasal sinus mucosal thickening. Trace right mastoid effusion. Electronically Signed   By: Jackey Loge DO   On: 07/27/2020 20:05   MR Cervical Spine Wo Contrast  Result Date: 07/27/2020 CLINICAL DATA:  Ataxia, cervical spine trauma EXAM: MRI CERVICAL SPINE WITHOUT CONTRAST TECHNIQUE: Multiplanar, multisequence MR imaging of the cervical spine was performed. No intravenous contrast was administered. COMPARISON:  CT same day FINDINGS: Alignment: Physiologic Vertebrae: The vertebral body heights are well maintained. Increased marrow signal seen at the endplates of C5-C6 and C6-C7. Cord: There is question of minimally increased STIR signal seen within the central cord at C5-C6 and C6-C7. No cord expansion however is noted. Posterior Fossa, vertebral arteries, paraspinal tissues: The visualized portion of the posterior fossa is unremarkable. Normal flow voids seen within the vertebral arteries. The paraspinal soft tissues are unremarkable. Disc levels: C1-C2: Atlanto-axial junction is normal, without canal narrowing C2-C3: No significant spinal canal or neural foraminal narrowing C3-C4: There is a disc osteophyte complex and uncovertebral osteophytes which causes severe neural foraminal narrowing. C4-C5: There is a disc osteophyte complex and uncovertebral osteophytes which causes moderate bilateral neural foraminal narrowing. C5-C6: There is a disc osteophyte complex and a left lateral  recess disc protrusion which contacts and impinges  the left C6 nerve root. There is severe left and moderate right neural foraminal narrowing. Mild central canal stenosis is noted. C6-C7: There is a disc osteophyte complex and uncovertebral osteophytes with a right far lateral disc protrusion which contacts and impinges the right C7 nerve root. There is severe bilateral neural foraminal narrowing and mild to moderate central canal stenosis. C7-T1: No significant spinal canal or neural foraminal narrowing IMPRESSION: 1. 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 2. Cervical spine spondylosis most notable C5-C6 with a left lateral recess disc protrusion contacting and impinging the left C6 nerve root with severe left neural foraminal narrowing and mild central canal stenosis. 3. Also at C6-C7 with a right far lateral disc protrusion contacting and pinching the right C7 nerve root with severe bilateral neural foraminal narrowing and mild to moderate central canal stenosis. Electronically Signed   By: Jonna Clark M.D.   On: 07/27/2020 23:00   DG Pelvis Portable  Result Date: 07/27/2020 CLINICAL DATA:  Pelvic pain, fall EXAM: PORTABLE PELVIS 1-2 VIEWS COMPARISON:  CT 05/21/2017 FINDINGS: There is no evidence of pelvic fracture or diastasis. No pelvic bone lesions are seen. IMPRESSION: Negative. Electronically Signed   By: Jasmine Pang M.D.   On: 07/27/2020 21:06   ECHOCARDIOGRAM COMPLETE  Result Date: 07/28/2020    ECHOCARDIOGRAM REPORT   Patient Name:   AMAAR OSHITA Date of Exam: 07/28/2020 Medical Rec #:  [id number]        Height:       70.0 in Accession #:    [id number]       Weight:       200.0 lb Date of Birth:  08/20/1966       BSA:          2.087 m Patient Age:    53 years         BP:           183/110 mmHg Patient Gender: M                HR:           70 bpm. Exam Location:  [facility] Procedure: 2D Echo, Color Doppler, Cardiac Doppler and Intracardiac             Opacification Agent Indications:     R55 Syncope  History:         Patient has no prior history of Echocardiogram examinations.                  Stroke; Risk Factors:Hypertension. Hx of blood clot.  Sonographer:     Humphrey Rolls RDCS (AE) Referring Phys:  [id number] Mary Sella ECKSTAT Diagnosing Phys: Harold Hedge MD  Sonographer Comments: Suboptimal apical window. IMPRESSIONS  1. Left ventricular ejection fraction, by estimation, is 65 to 70%. Left ventricular ejection fraction by PLAX is 71 %. The left ventricle has normal function. The left ventricle has no regional wall motion abnormalities. Left ventricular diastolic parameters were normal.  2. Right ventricular systolic function is normal. The right ventricular size is normal.  3. The mitral valve is grossly normal. No evidence of mitral valve regurgitation.  4. The aortic valve is grossly normal. Aortic valve regurgitation is trivial.  5. Aortic dilatation noted. There is borderline dilatation of the aortic root measuring 37 mm. FINDINGS  Left Ventricle: Left ventricular ejection fraction, by estimation, is 65 to 70%. Left ventricular ejection fraction by PLAX is 71 %. The left ventricle  has normal function. The left ventricle has no regional wall motion abnormalities. Definity contrast agent was given IV to delineate the left ventricular endocardial borders. The left ventricular internal cavity size was normal in size. There is borderline left ventricular hypertrophy. Left ventricular diastolic parameters were normal. Right Ventricle: The right ventricular size is normal. No increase in right ventricular wall thickness. Right ventricular systolic function is normal. Left Atrium: Left atrial size was normal in size. Right Atrium: Right atrial size was normal in size. Pericardium: There is no evidence of pericardial effusion. Mitral Valve: The mitral valve is grossly normal. No evidence of mitral valve regurgitation. MV peak gradient, 1.7 mmHg. The mean mitral valve  gradient is 1.0 mmHg. Tricuspid Valve: The tricuspid valve is grossly normal. Tricuspid valve regurgitation is not demonstrated. Aortic Valve: The aortic valve is grossly normal. Aortic valve regurgitation is trivial. Aortic valve mean gradient measures 2.0 mmHg. Aortic valve peak gradient measures 3.8 mmHg. Aortic valve area, by VTI measures 4.03 cm. Pulmonic Valve: The pulmonic valve was not well visualized. Pulmonic valve regurgitation is not visualized. Aorta: Aortic dilatation noted. There is borderline dilatation of the aortic root measuring 37 mm. IAS/Shunts: The interatrial septum was not assessed.  LEFT VENTRICLE PLAX 2D LV EF:         Left            Diastology                ventricular     LV e' lateral:   8.05 cm/s                ejection        LV E/e' lateral: 6.7                fraction by     LV e' medial:    5.55 cm/s                PLAX is 71      LV E/e' medial:  9.7                %. LVIDd:         4.47 cm LVIDs:         2.69 cm LV PW:         1.12 cm LV IVS:        1.03 cm LVOT diam:     2.50 cm LV SV:         74 LV SV Index:   36 LVOT Area:     4.91 cm  LEFT ATRIUM             Index LA diam:        3.40 cm 1.63 cm/m LA Vol (A2C):   36.2 ml 17.34 ml/m LA Vol (A4C):   26.4 ml 12.65 ml/m LA Biplane Vol: 31.6 ml 15.14 ml/m  AORTIC VALVE                   PULMONIC VALVE AV Area (Vmax):    3.88 cm    PV Vmax:       0.69 m/s AV Area (Vmean):   4.06 cm    PV Vmean:      49.600 cm/s AV Area (VTI):     4.03 cm    PV VTI:        0.130 m AV Vmax:           97.40 cm/s  PV Peak grad:  1.9 mmHg AV Vmean:          63.800 cm/s PV Mean grad:  1.0 mmHg AV VTI:            0.184 m AV Peak Grad:      3.8 mmHg AV Mean Grad:      2.0 mmHg LVOT Vmax:         76.90 cm/s LVOT Vmean:        52.800 cm/s LVOT VTI:          0.151 m LVOT/AV VTI ratio: 0.82  AORTA Ao Root diam: 3.70 cm MITRAL VALVE MV Area (PHT): 4.89 cm    SHUNTS MV Peak grad:  1.7 mmHg    Systemic VTI:  0.15 m MV Mean grad:  1.0 mmHg    Systemic  Diam: 2.50 cm MV Vmax:       0.66 m/s MV Vmean:      39.6 cm/s MV Decel Time: 155 msec MV E velocity: 53.80 cm/s MV A velocity: 59.70 cm/s MV E/A ratio:  0.90 Harold Hedge MD Electronically signed by Harold Hedge MD Signature Date/Time: 07/28/2020/2:53:18 PM    Final     Subjective:   Patient was seen and examined 08/02/2020, 7:28 AM Patient stable today. No acute distress.  No issues overnight Stable for discharge.  Discharge Exam:    Vitals:   08/01/20 1436 08/01/20 1803 08/01/20 1950 08/02/20 0410  BP: (!) 137/100 (!) 141/96 (!) 136/101 (!) 145/96  Pulse:  74 79 68  Resp:   18 18  Temp:  97.8 F (36.6 C) 98.6 F (37 C) 97.8 F (36.6 C)  TempSrc:    Oral  SpO2:  99% 100% 99%  Weight:      Height:        General: Pt lying comfortably in bed & appears in no obvious distress. Cardiovascular: S1 & S2 heard, RRR, S1/S2 +. No murmurs, rubs, gallops or clicks. No JVD or pedal edema. Respiratory: Clear to auscultation without wheezing, rhonchi or crackles. No increased work of breathing. Abdominal:  Non-distended, non-tender & soft. No organomegaly or masses appreciated. Normal bowel sounds heard. CNS: Alert and oriented. No focal deficits. Extremities: no edema, no cyanosis    The results of significant diagnostics from this hospitalization (including imaging, microbiology, ancillary and laboratory) are listed below for reference.      Microbiology:   Recent Results (from the past 240 hour(s))  SARS Coronavirus 2 by RT PCR (hospital order, performed in [facility] hospital lab) Nasopharyngeal Nasopharyngeal Swab     Status: None   Collection Time: 07/27/20  4:53 PM   Specimen: Nasopharyngeal Swab  Result Value Ref Range Status   SARS Coronavirus 2 NEGATIVE NEGATIVE Final    Comment: (NOTE) SARS-CoV-2 target nucleic acids are NOT DETECTED.  The SARS-CoV-2 RNA is generally detectable in upper and lower respiratory specimens during the acute phase of infection. The  lowest concentration of SARS-CoV-2 viral copies this assay can detect is 250 copies / mL. A negative result does not preclude SARS-CoV-2 infection and should not be used as the sole basis for treatment or other patient management decisions.  A negative result may occur with improper specimen collection / handling, submission of specimen other than nasopharyngeal swab, presence of viral mutation(s) within the areas targeted by this assay, and inadequate number of viral copies (<250 copies / mL). A negative result must be combined with clinical observations, patient history, and epidemiological information.  Fact Sheet for Patients:  BoilerBrush.com.cy  Fact Sheet for Healthcare Providers: https://pope.com/  This test is not yet approved or  cleared by the [place] FDA and has been authorized for detection and/or diagnosis of SARS-CoV-2 by FDA under an Emergency Use Authorization (EUA).  This EUA will remain in effect (meaning this test can be used) for the duration of the COVID-19 declaration under Section 564(b)(1) of the Act, 21 U.S.C. section 360bbb-3(b)(1), unless the authorization is terminated or revoked sooner.  Performed at [facility], [address] [place], [place] [address]      Labs:   CBC: Recent Labs  Lab 07/27/20 [phone] 07/28/20 0346 07/29/20 0537  WBC 10.0 8.1 7.1  HGB 17.6* 15.7 16.6  HCT 47.3 43.5 RESULTS UNAVAILABLE DUE TO INTERFERING SUBSTANCE  MCV 104.2* 106.1* RESULTS UNAVAILABLE DUE TO INTERFERING SUBSTANCE  PLT 231 191 206   Basic Metabolic Panel: Recent Labs  Lab 07/27/20 0739 07/27/20 1639 07/28/20 0346 07/29/20 0537  NA 136  --  135 132*  K 3.1*  --  3.9 3.8  CL 101  --  103 101  CO2 24  --  28 24  GLUCOSE 110*  --  96 93  BUN 10  --  10 15  CREATININE 0.87  --  0.83 0.95  CALCIUM 9.3  --  8.6* 8.8*  MG  --  2.0  --   --    Liver Function Tests: Recent Labs  Lab  07/27/20 1639 07/28/20 0346 07/29/20 0537  AST 33 29 31  ALT 37 29 31  ALKPHOS 66 51 55  BILITOT 0.9 1.3* 1.1  PROT 8.1 6.3* 6.5  ALBUMIN 4.1 3.3* 3.4*   BNP (last 3 results) No results for input(s): BNP in the last 8760 hours. Cardiac Enzymes: No results for input(s): CKTOTAL, CKMB, CKMBINDEX, TROPONINI in the last 168 hours. CBG: Recent Labs  Lab 07/31/20 1736  GLUCAP 165*   Hgb A1c No results for input(s): HGBA1C in the last 72 hours. Lipid Profile No results for input(s): CHOL, HDL, LDLCALC, TRIG, CHOLHDL, LDLDIRECT in the last 72 hours. Thyroid function studies No results for input(s): TSH, T4TOTAL, T3FREE, THYROIDAB in the last 72 hours.  Invalid input(s): FREET3 Anemia work up No results for input(s): VITAMINB12, FOLATE, FERRITIN, TIBC, IRON, RETICCTPCT in the last 72 hours. Urinalysis    Component Value Date/Time   COLORURINE YELLOW (A) 07/27/2020 0739   APPEARANCEUR HAZY (A) 07/27/2020 0739   LABSPEC 1.019 07/27/2020 0739   PHURINE 5.0 07/27/2020 0739   GLUCOSEU NEGATIVE 07/27/2020 0739   HGBUR MODERATE (A) 07/27/2020 0739   BILIRUBINUR NEGATIVE 07/27/2020 0739   KETONESUR NEGATIVE 07/27/2020 0739   PROTEINUR NEGATIVE 07/27/2020 0739   NITRITE NEGATIVE 07/27/2020 0739   LEUKOCYTESUR NEGATIVE 07/27/2020 0739         Time coordinating discharge: Over 45 minutes  SIGNED: Kendell Bane, MD, FACP, FHM. [facility],  Please use [email or web address] to Page If 7PM-7AM, please contact night-coverage [email or web address], Password [facility] 08/02/2020, 7:28 AM

## 2020-08-04 LAB — VITAMIN B1: Vitamin B1 (Thiamine): 262.9 nmol/L — ABNORMAL HIGH (ref 66.5–200.0)

## 2020-08-06 ENCOUNTER — Telehealth: Payer: Self-pay | Admitting: Gerontology

## 2020-08-06 LAB — COPPER, SERUM: Copper: 96 ug/dL (ref 69–132)

## 2020-08-06 NOTE — Telephone Encounter (Signed)
-----   Message from Benjamin Stain, [place] sent at 07/28/2020 12:04 PM EDT ----- Regarding: FW: New patient Please call patient next week to review eligibility and necessary paperwork. ----- Message ----- From: [place] Cellar, RN Sent: 07/28/2020  11:25 AM EDT To: Benjamin Stain, CMA Subject: New patient                                    Hello,   This patient is being admitted to hospital however I anticipate a short stay. He has no insurance and I have provided him with application for [facility] however I think he will benefit from a call post discharge if you guys have the time.   Thank you  Dagoberto Reef RN CM

## 2021-03-15 IMAGING — MR MR HEAD W/O CM
9 of 10 series · 41 of 48 positions shown · non-contrast
Comparison: Head CT performed earlier the same day 07/27/2020.

CLINICAL DATA: Neuro deficit, acute, stroke suspected. Additional
history provided: Patient reports right-sided weakness, tunnel
vision, stumbling, muscle weakness for 2 months.

EXAM:
MRI HEAD WITHOUT CONTRAST
TECHNIQUE: Multiplanar, multiecho pulse sequences of the brain and surrounding
structures were obtained without intravenous contrast.

[Series 5: ax dwi_tracew · axial · 3.0mm · 0.71mm/px · z∈[-117,+41]mm · 8 of 112 slices shown]
[im 1/112]
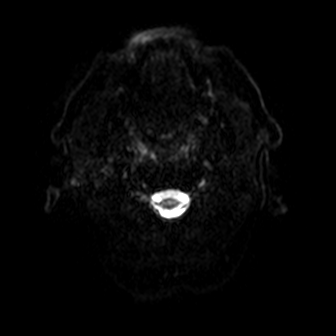
[im 21/112]
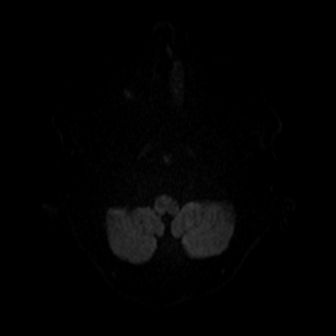
[im 31/112]
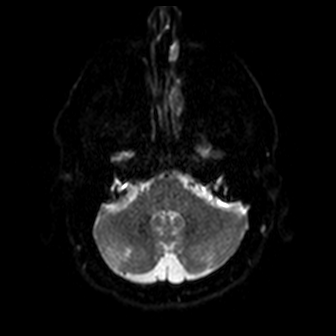
[im 51/112]
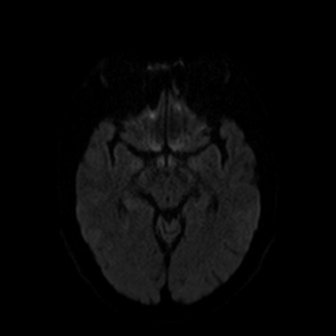
[im 61/112]
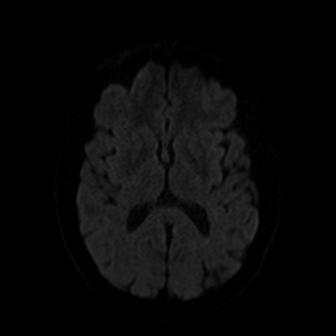
[im 81/112]
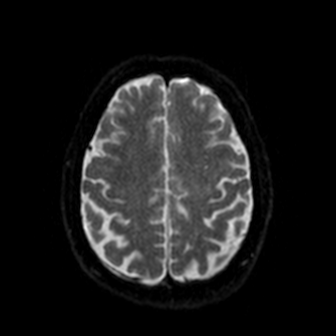
[im 91/112]
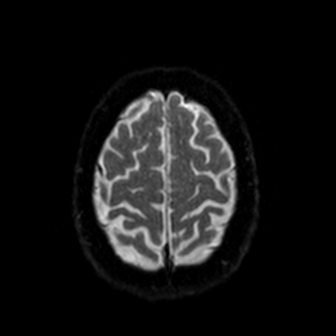
[im 112/112]
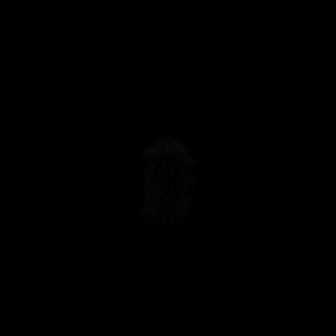

[Series 6: ax dwi_adc · axial · 3.0mm · 0.71mm/px · z∈[-117,+41]mm · 6 of 56 slices shown]
[im 1/56]
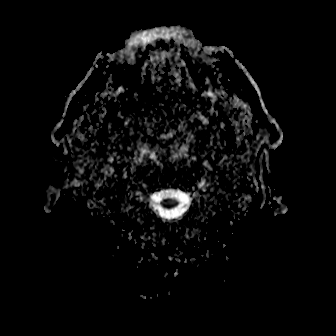
[im 12/56]
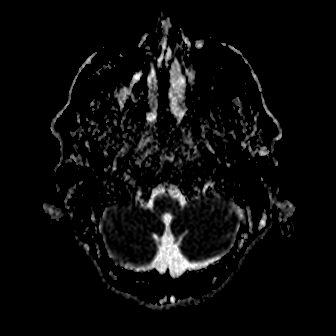
[im 23/56]
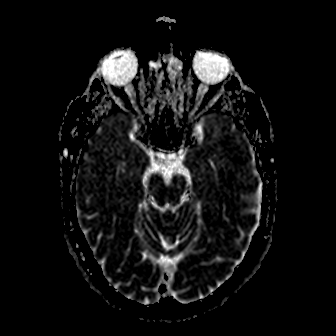
[im 34/56]
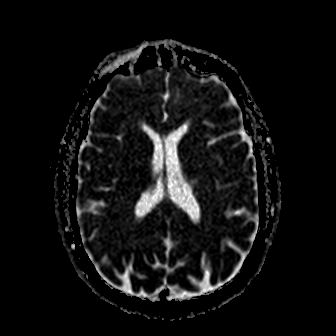
[im 45/56]
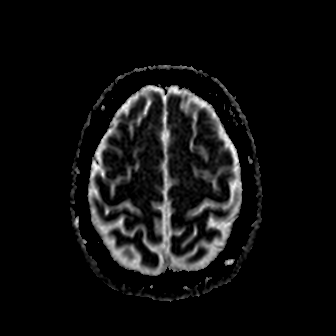
[im 56/56]
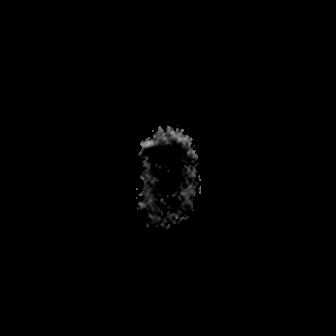

[Series 7: cor dwi_tracew · coronal · 5.0mm · 0.68mm/px · 8 of 79 slices shown]
[im 1/79]
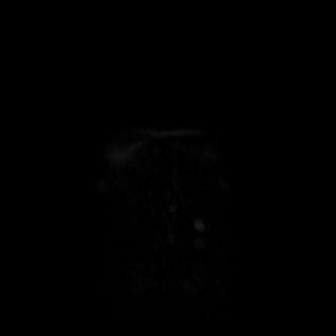
[im 12/79]
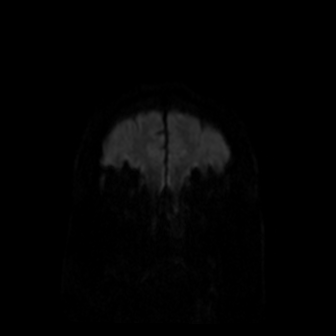
[im 23/79]
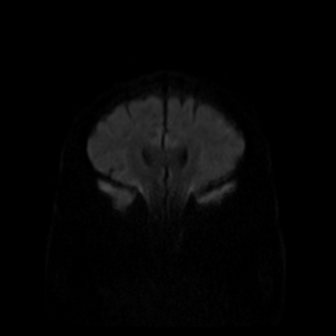
[im 34/79]
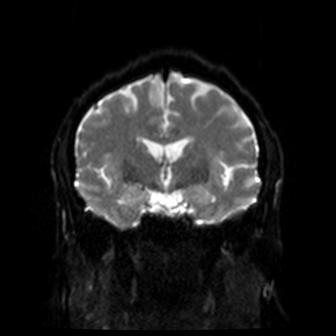
[im 45/79]
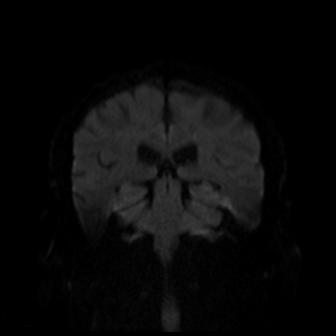
[im 56/79]
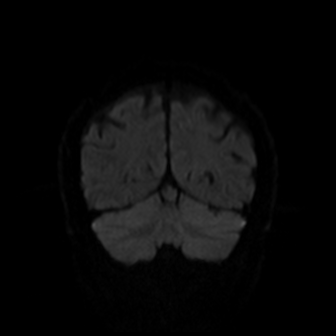
[im 67/79]
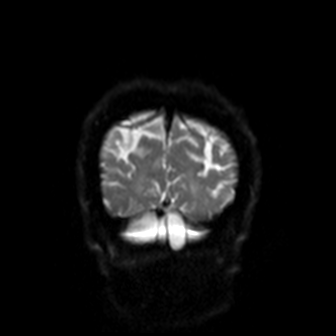
[im 79/79]
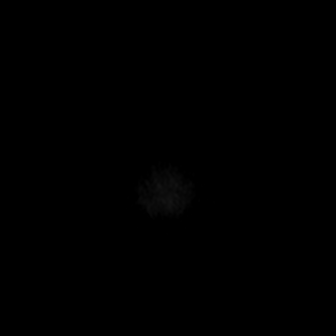

[Series 8: cor dwi_adc · coronal · 5.0mm · 0.68mm/px · 4 of 40 slices shown]
[im 1/40]
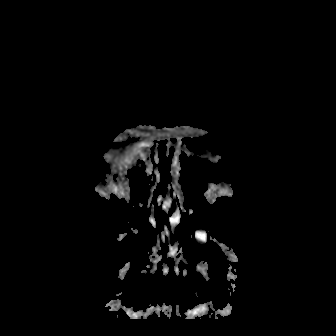
[im 14/40]
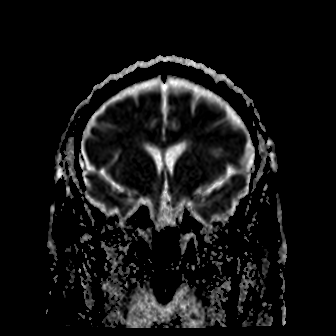
[im 27/40]
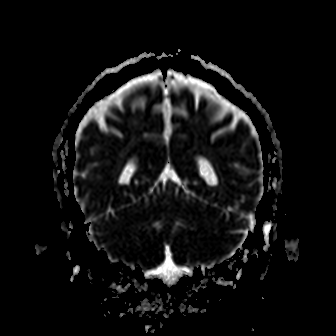
[im 40/40]
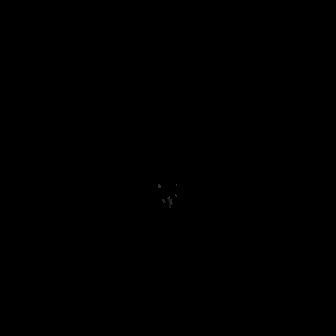

[Series 9: T1 · sagittal · 5.0mm · 0.94mm/px · 3 of 25 slices shown (1 of 2)]
[im 1/25]
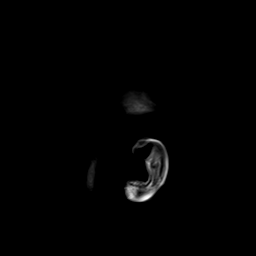
[im 13/25]
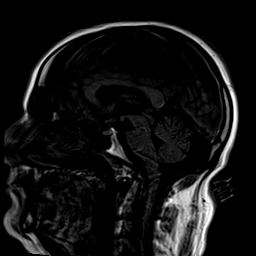
[im 25/25]
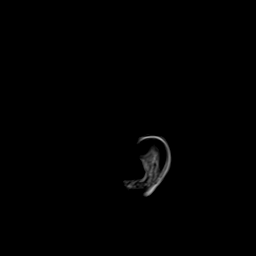

[Series 10: T2 · axial · 5.0mm · 0.45mm/px · z∈[-110,+40]mm · 3 of 27 slices shown (1 of 2)]
[im 1/27]
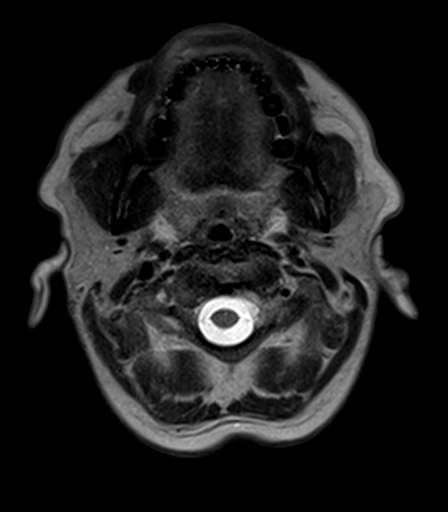
[im 14/27]
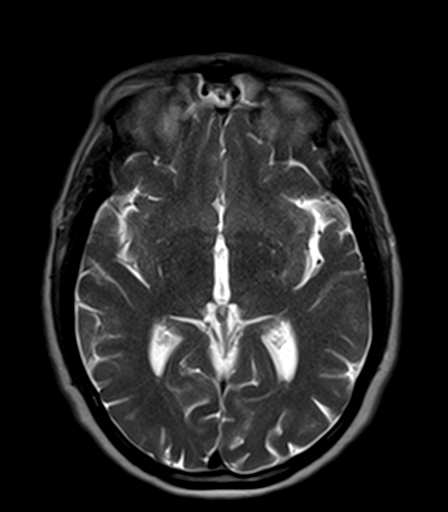
[im 27/27]
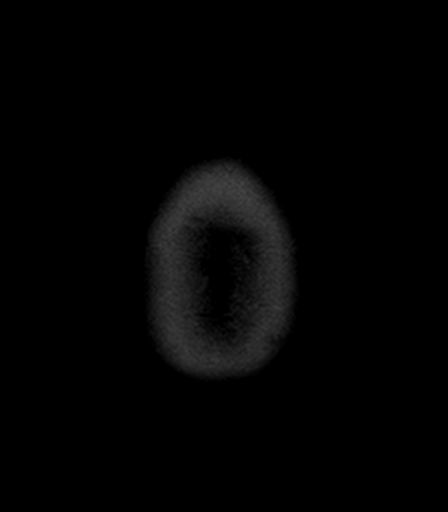

[Series 12: FLAIR · axial · 5.0mm · 1.20mm/px · z∈[-111,+39]mm · 3 of 27 slices shown]
[im 1/27]
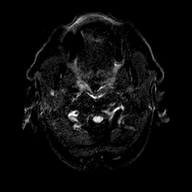
[im 14/27]
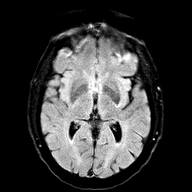
[im 27/27]
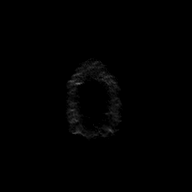

[Series 13: T1 · axial · 5.0mm · 0.90mm/px · z∈[-110,+40]mm · 3 of 27 slices shown (2 of 2)]
[im 1/27]
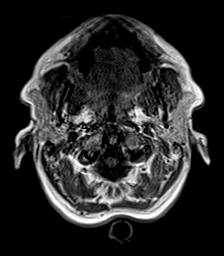
[im 14/27]
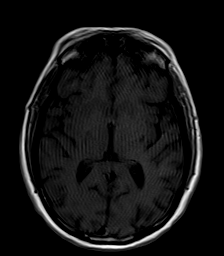
[im 27/27]
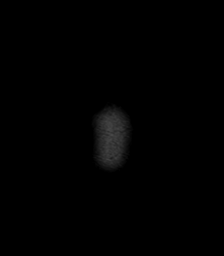

[Series 14: T2 · coronal · 5.0mm · 0.45mm/px · 3 of 31 slices shown (2 of 2)]
[im 1/31]
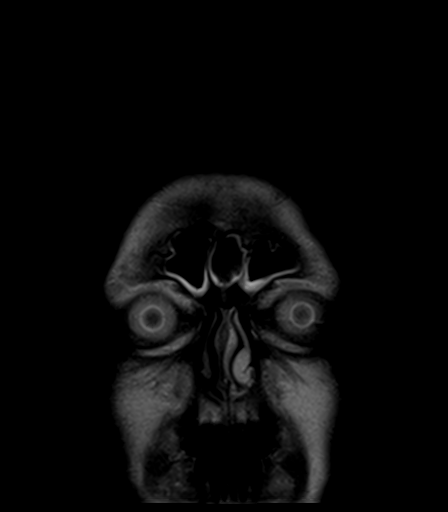
[im 16/31]
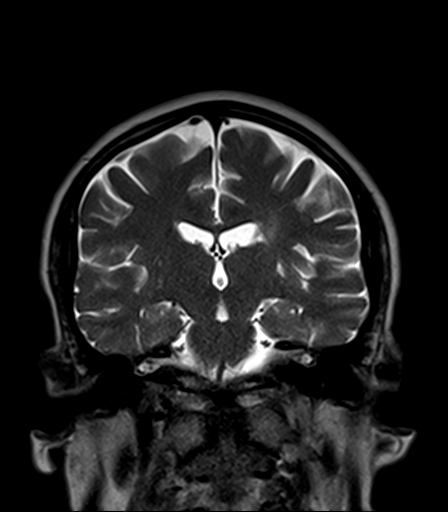
[im 31/31]
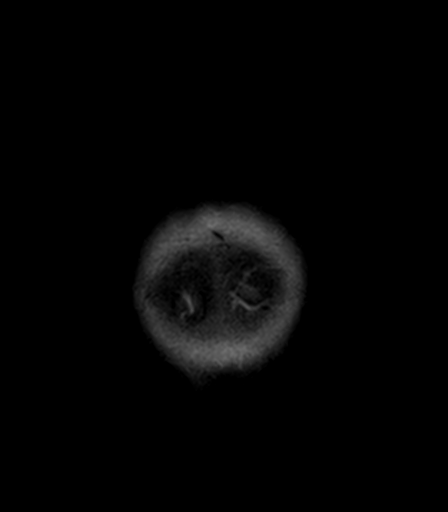

[41 of 48 positions shown; findings below may reference images not displayed]

FINDINGS: Brain:

The examination is intermittently motion degraded. Most notably,
there is moderate/severe motion degradation of the sagittal T1
weighted sequence, moderate motion degradation of the axial T2/FLAIR
sequence and mild-to-moderate motion degradation of the axial T1
weighted sequence.

Mild generalized parenchymal atrophy.

Chronic small-vessel infarct within the left corona radiata/basal
ganglia.

Minimal background scattered T2/FLAIR hyperintensity within the
cerebral white matter is nonspecific, but consistent with chronic
small vessel ischemic disease.

Tiny chronic infarct within the right cerebellar hemisphere.

There is no acute infarct.

No evidence of intracranial mass.

No chronic intracranial blood products.

No extra-axial fluid collection.

No midline shift.

Vascular: Expected proximal arterial flow voids.

Skull and upper cervical spine: No focal marrow lesion is identified
within the limitations of motion degradation.

Sinuses/Orbits: Visualized orbits show no acute finding. Mild
diffuse paranasal sinus mucosal thickening. Trace fluid within right
mastoid air cells.
IMPRESSION: Intermittently motion degraded examination as described.

No evidence of acute intracranial abnormality, including acute
infarction.

Chronic small-vessel infarcts are present within the left corona
radiata/basal ganglia and right cerebellar hemisphere.

Background mild generalized parenchymal atrophy and cerebral white
matter chronic small vessel ischemic disease.

Mild paranasal sinus mucosal thickening.

Trace right mastoid effusion.

## 2021-03-15 IMAGING — CT CT CERVICAL SPINE W/O CM
3 of 4 series · 12 of 33 positions shown, 14 images · non-contrast
Comparison: None.

CLINICAL DATA: Fall, head injury

EXAM:
CT HEAD WITHOUT CONTRAST
CT CERVICAL SPINE WITHOUT CONTRAST
TECHNIQUE: Multidetector CT imaging of the head and cervical spine was
performed following the standard protocol without intravenous
contrast. Multiplanar CT image reconstructions of the cervical spine
were also generated.

[Series 6: sagittal bone · sagittal · 0.29mm/px · 5 of 61 slices shown, 6 images]
[im 21/61  bone]
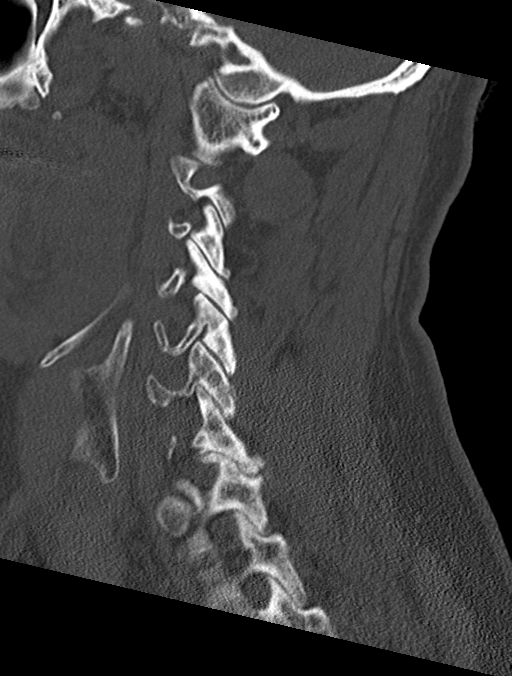
[im 26/61  bone]
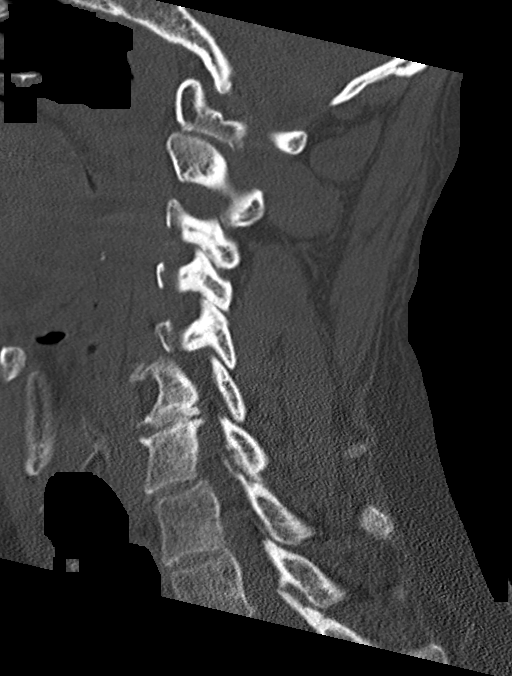
[im 31/61  soft-tissue]
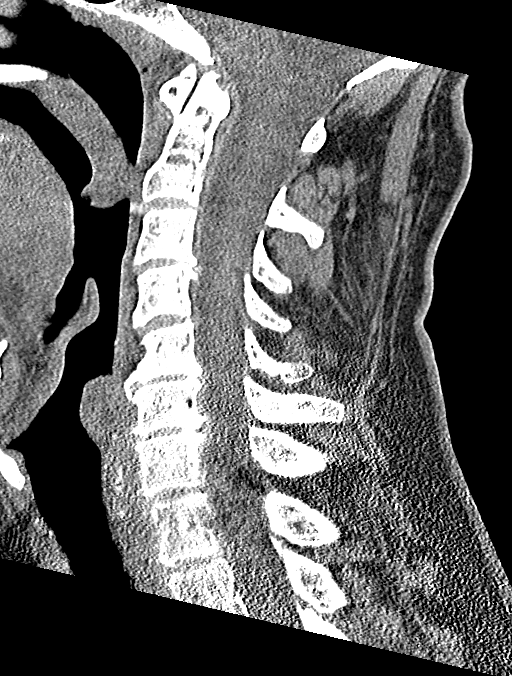
[im 31/61  bone]
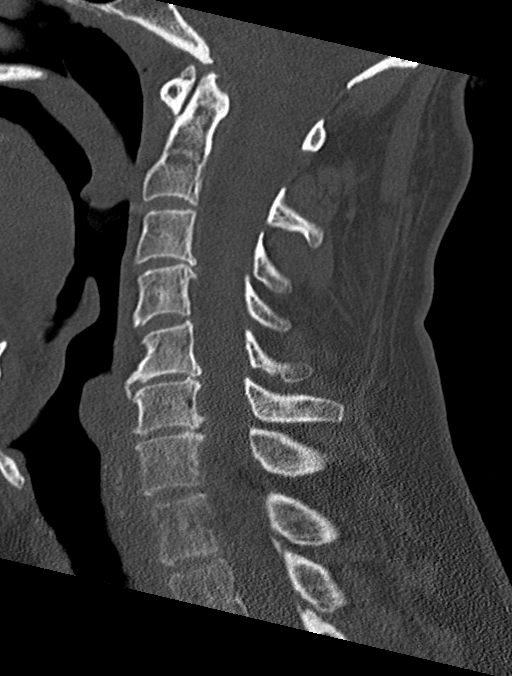
[im 36/61  bone]
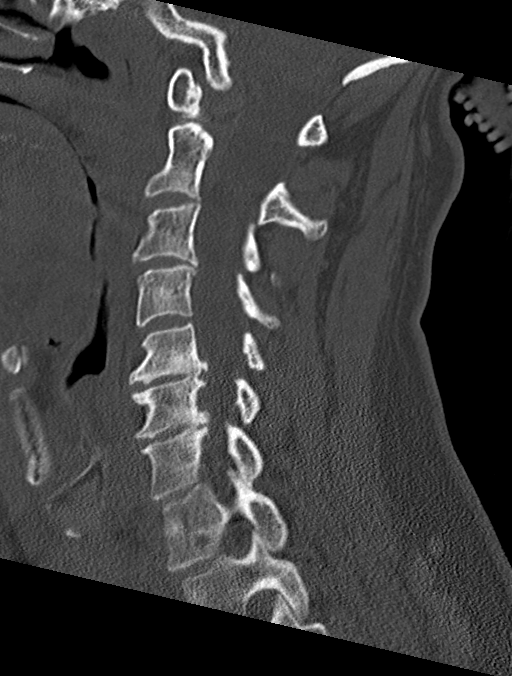
[im 41/61  bone]
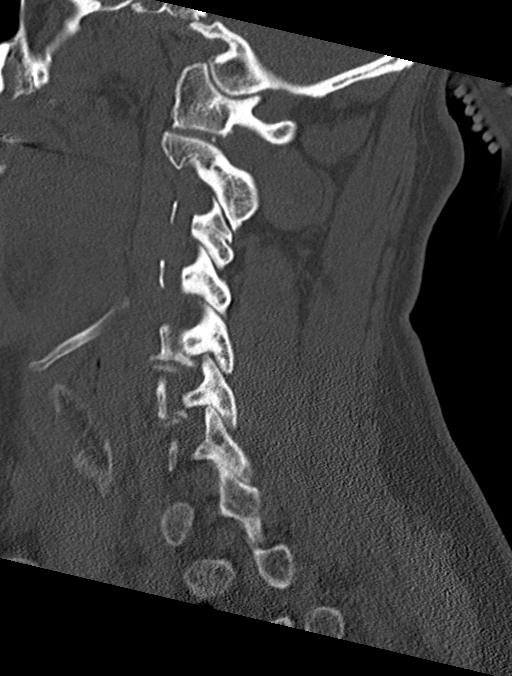

[Series 7: coronal bone · coronal · 0.25mm/px · 3 of 70 slices shown]
[im 14/70  bone]
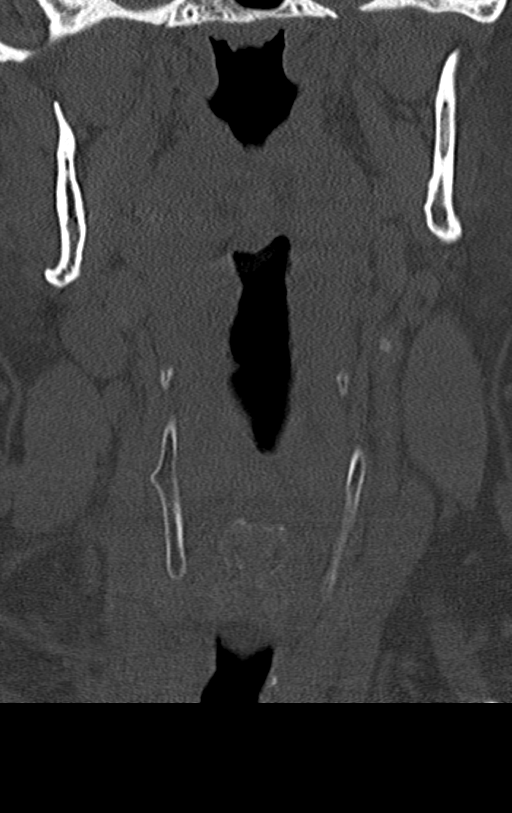
[im 28/70  bone]
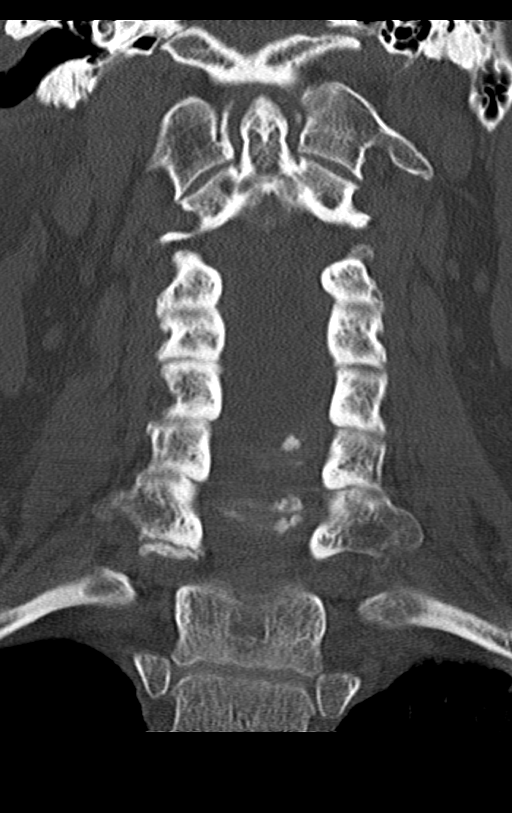
[im 42/70  bone]
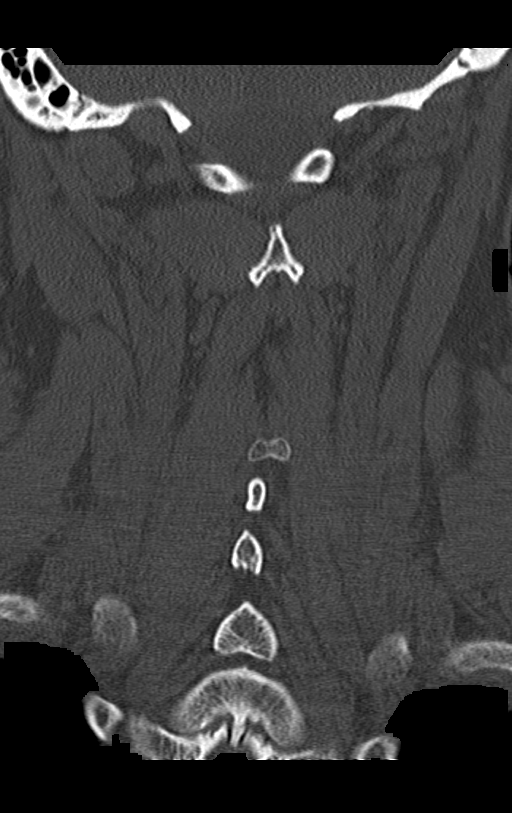

[Series 8: orthogonal bone · axial · 0.23mm/px · z∈[-299,-162]mm · 4 of 101 slices shown, 5 images]
[im 15/101  soft-tissue]
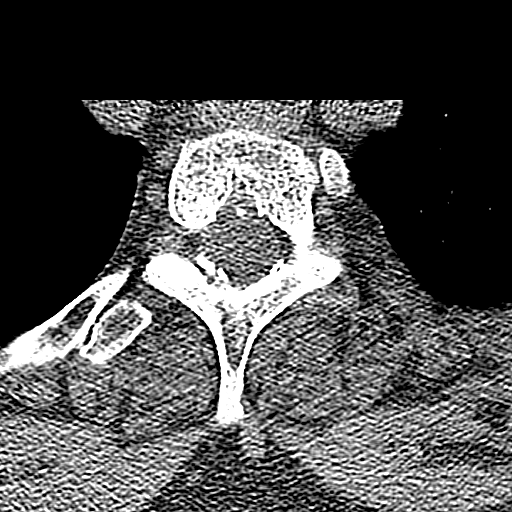
[im 15/101  bone]
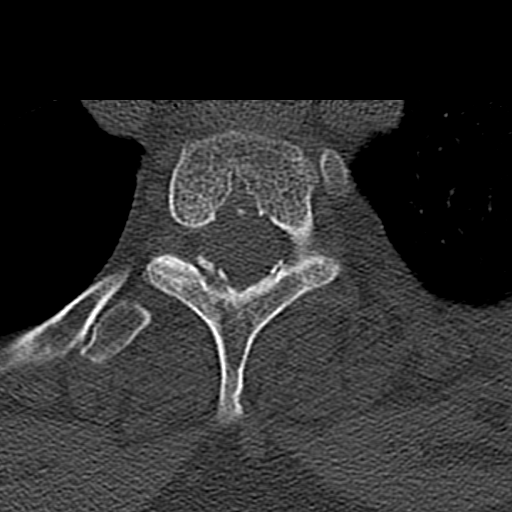
[im 43/101  bone]
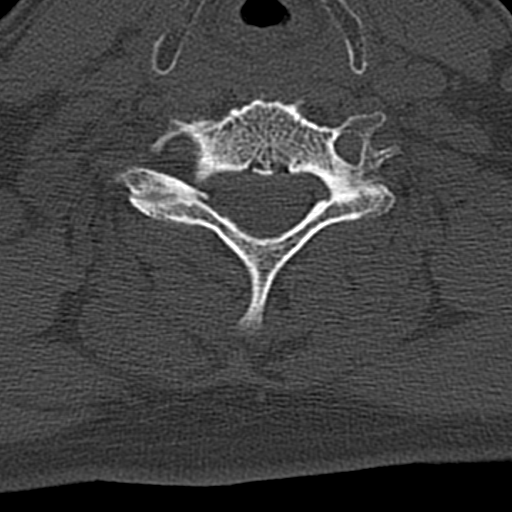
[im 58/101  bone]
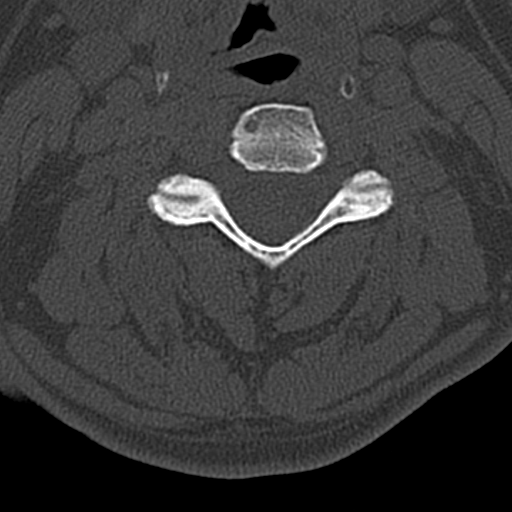
[im 86/101  bone]
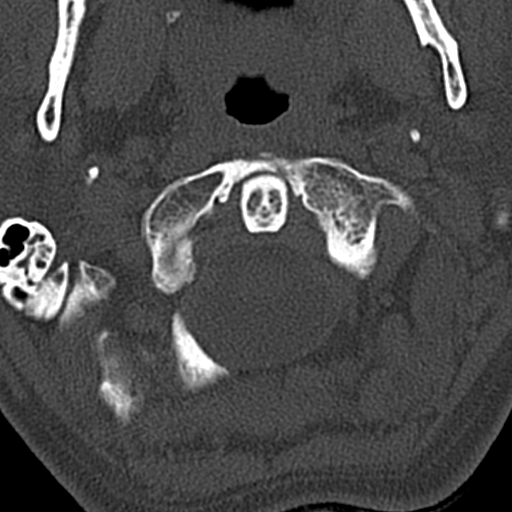

[12 of 33 positions shown; findings below may reference images not displayed]

FINDINGS: CT HEAD FINDINGS

Brain: Normal anatomic configuration. No abnormal intra or
extra-axial mass lesion or fluid collection. No abnormal mass effect
or midline shift. No evidence of acute intracranial hemorrhage or
infarct. Ventricular size is normal. Cerebellum unremarkable.

Vascular: Unremarkable

Skull: Intact

Sinuses/Orbits: There is moderate mucosal thickening within the
frontal sinuses bilaterally without associated air-fluid levels.
Remaining paranasal sinuses are clear. Orbits are unremarkable.

Other: Mastoid air cells and middle ear cavities are clear. There is
mild soft tissue swelling superficial to the right supraorbital
ridge. There is mild soft tissue infiltration in keeping with edema
or subcutaneous hemorrhage involving the soft tissues of the right
temporal fossa.

CT CERVICAL SPINE FINDINGS

Alignment: Normal cervical lordosis. No listhesis of the cervical
spine.

Skull base and vertebrae: The craniocervical junction is
unremarkable. The atlantodental interval is normal. There is no
acute cervical spine fracture. No lytic or blastic bone lesion is
identified.

Soft tissues and spinal canal: No prevertebral fluid or swelling. No
visible canal hematoma.

Disc levels: Review of the sagittal images demonstrates
intervertebral disc space narrowing and endplate remodeling at C5-6
and C6-7 in keeping with changes of moderate degenerative disc
disease res. Small posteriorly oriented disc osteophytes are noted
at this level. Remaining intervertebral disc heights and vertebral
body heights are preserved. Review of the axial images demonstrates:

C1-2: Unremarkable

C2-3: Unremarkable

C3-4: Mild bilateral uncovertebral disease. No significant facet
arthrosis. Mild resultant bilateral neural foraminal narrowing,
right greater than left. No significant canal stenosis.

C4-5: Unremarkable.

C5-6: Severe left uncovertebral arthrosis results in moderate left
neural foraminal narrowing. Minimal right uncovertebral and facet
arthrosis. No significant neural foraminal narrowing on the right.
No significant canal stenosis.

C6-7: Moderate bilateral uncovertebral arthrosis. Mild right and
mild-to-moderate left neural foraminal narrowing. No significant
canal stenosis.

C7-T1: Severe right facet arthrosis. No significant neural foraminal
narrowing. No significant canal stenosis.

Upper chest: Right apical bullous change noted.

Other: None significant
IMPRESSION: 1. No acute intracranial abnormality.
2. No acute cervical spine fracture.
3. Moderate degenerative disc disease at C5-6 and C6-7.
4. Moderate left neural foraminal narrowing at C5-6 due to
uncovertebral arthrosis.
5. Mild soft tissue swelling superficial to the right supraorbital
ridge and the soft tissues of the right temporal fossa.

## 2021-05-18 ENCOUNTER — Ambulatory Visit (INDEPENDENT_AMBULATORY_CARE_PROVIDER_SITE_OTHER): Payer: BC Managed Care – PPO | Admitting: Family Medicine

## 2021-05-18 ENCOUNTER — Encounter: Payer: Self-pay | Admitting: Family Medicine

## 2021-05-18 ENCOUNTER — Other Ambulatory Visit: Payer: Self-pay

## 2021-05-18 VITALS — BP 154/103 | HR 96 | Ht 71.0 in | Wt 199.6 lb

## 2021-05-18 DIAGNOSIS — K219 Gastro-esophageal reflux disease without esophagitis: Secondary | ICD-10-CM

## 2021-05-18 DIAGNOSIS — E782 Mixed hyperlipidemia: Secondary | ICD-10-CM | POA: Diagnosis not present

## 2021-05-18 DIAGNOSIS — I6930 Unspecified sequelae of cerebral infarction: Secondary | ICD-10-CM

## 2021-05-18 DIAGNOSIS — E538 Deficiency of other specified B group vitamins: Secondary | ICD-10-CM | POA: Diagnosis not present

## 2021-05-18 DIAGNOSIS — I10 Essential (primary) hypertension: Secondary | ICD-10-CM

## 2021-05-18 DIAGNOSIS — Z86711 Personal history of pulmonary embolism: Secondary | ICD-10-CM

## 2021-05-18 DIAGNOSIS — J432 Centrilobular emphysema: Secondary | ICD-10-CM

## 2021-05-18 DIAGNOSIS — R270 Ataxia, unspecified: Secondary | ICD-10-CM

## 2021-05-18 DIAGNOSIS — Z720 Tobacco use: Secondary | ICD-10-CM

## 2021-05-18 MED ORDER — VITAMIN B-12 1000 MCG PO TABS: 1000.0000 ug | ORAL_TABLET | Freq: Every day | ORAL | 1 refills | Status: AC

## 2021-05-18 MED ORDER — OMEPRAZOLE 40 MG PO CPDR: 40.0000 mg | DELAYED_RELEASE_CAPSULE | Freq: Every day | ORAL | 1 refills | Status: DC

## 2021-05-18 MED ORDER — ALBUTEROL SULFATE HFA 108 (90 BASE) MCG/ACT IN AERS: 2.0000 | INHALATION_SPRAY | Freq: Four times a day (QID) | RESPIRATORY_TRACT | 2 refills | Status: DC | PRN

## 2021-05-18 MED ORDER — SPIRIVA RESPIMAT 2.5 MCG/ACT IN AERS: 2.0000 | INHALATION_SPRAY | Freq: Every day | RESPIRATORY_TRACT | 1 refills | Status: DC

## 2021-05-18 MED ORDER — ATORVASTATIN CALCIUM 80 MG PO TABS: 80.0000 mg | ORAL_TABLET | Freq: Every day | ORAL | 1 refills | Status: AC

## 2021-05-18 MED ORDER — AMLODIPINE BESYLATE 10 MG PO TABS: 10.0000 mg | ORAL_TABLET | Freq: Every day | ORAL | 1 refills | Status: DC
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
## 2021-05-18 NOTE — Patient Instructions (Signed)
Thank you for coming to the office today.  Start Omeprazole 40mg  daily for stomach acid  Start Albuterol rescue inhaler as needed  Start Spiriva inhaler for COPD maintenance 2 puffs every day - check with pharmacy if this is covered or if you need to send it to [facility], check with them whatever is better price / coverage  [facility] - Neurology Dept [address] [place], [place] Phone: [phone]  Referral to Neurology to discuss history of stroke, balance, and discuss blood thinner or treatments to avoid future stroke.   Please schedule a Follow-up Appointment to: No follow-ups on file.  If you have any other questions or concerns, please feel free to call the office or send a message through MyChart. You may also schedule an earlier appointment if necessary.  Additionally, you may be receiving a survey about your experience at our office within a few days to 1 week by e-mail or mail. We value your feedback.  [phone], DO [facility], [facility]

## 2021-06-29 ENCOUNTER — Ambulatory Visit: Payer: Self-pay | Admitting: Family Medicine

## 2022-08-17 DIAGNOSIS — R0602 Shortness of breath: Secondary | ICD-10-CM | POA: Diagnosis not present

## 2022-08-17 DIAGNOSIS — R059 Cough, unspecified: Secondary | ICD-10-CM | POA: Diagnosis not present

## 2022-08-17 DIAGNOSIS — J029 Acute pharyngitis, unspecified: Secondary | ICD-10-CM | POA: Diagnosis not present

## 2022-08-17 DIAGNOSIS — J019 Acute sinusitis, unspecified: Secondary | ICD-10-CM | POA: Diagnosis not present

## 2022-08-17 DIAGNOSIS — J441 Chronic obstructive pulmonary disease with (acute) exacerbation: Secondary | ICD-10-CM | POA: Diagnosis not present

## 2023-04-18 DIAGNOSIS — M722 Plantar fascial fibromatosis: Secondary | ICD-10-CM | POA: Diagnosis not present

## 2023-04-18 DIAGNOSIS — I6930 Unspecified sequelae of cerebral infarction: Secondary | ICD-10-CM | POA: Diagnosis not present

## 2023-04-18 DIAGNOSIS — Z86711 Personal history of pulmonary embolism: Secondary | ICD-10-CM | POA: Diagnosis not present

## 2023-04-18 DIAGNOSIS — Z720 Tobacco use: Secondary | ICD-10-CM | POA: Diagnosis not present

## 2023-04-18 DIAGNOSIS — M79672 Pain in left foot: Secondary | ICD-10-CM | POA: Diagnosis not present

## 2023-08-23 ENCOUNTER — Ambulatory Visit (INDEPENDENT_AMBULATORY_CARE_PROVIDER_SITE_OTHER): Payer: BC Managed Care – PPO | Admitting: Family Medicine

## 2023-08-23 ENCOUNTER — Encounter: Payer: Self-pay | Admitting: Family Medicine

## 2023-08-23 VITALS — BP 140/90 | HR 88 | Temp 98.0°F | Ht 71.0 in | Wt 197.0 lb

## 2023-08-23 DIAGNOSIS — J432 Centrilobular emphysema: Secondary | ICD-10-CM

## 2023-08-23 DIAGNOSIS — I10 Essential (primary) hypertension: Secondary | ICD-10-CM | POA: Diagnosis not present

## 2023-08-23 DIAGNOSIS — Z1159 Encounter for screening for other viral diseases: Secondary | ICD-10-CM

## 2023-08-23 DIAGNOSIS — K219 Gastro-esophageal reflux disease without esophagitis: Secondary | ICD-10-CM

## 2023-08-23 DIAGNOSIS — E782 Mixed hyperlipidemia: Secondary | ICD-10-CM

## 2023-08-23 DIAGNOSIS — K279 Peptic ulcer, site unspecified, unspecified as acute or chronic, without hemorrhage or perforation: Secondary | ICD-10-CM

## 2023-08-23 DIAGNOSIS — R233 Spontaneous ecchymoses: Secondary | ICD-10-CM

## 2023-08-23 DIAGNOSIS — Z720 Tobacco use: Secondary | ICD-10-CM

## 2023-08-23 DIAGNOSIS — F1010 Alcohol abuse, uncomplicated: Secondary | ICD-10-CM

## 2023-08-23 DIAGNOSIS — G4700 Insomnia, unspecified: Secondary | ICD-10-CM

## 2023-08-23 DIAGNOSIS — K2100 Gastro-esophageal reflux disease with esophagitis, without bleeding: Secondary | ICD-10-CM | POA: Diagnosis not present

## 2023-08-23 DIAGNOSIS — R7309 Other abnormal glucose: Secondary | ICD-10-CM

## 2023-08-23 MED ORDER — SUCRALFATE 1 G PO TABS
1.0000 g | ORAL_TABLET | Freq: Three times a day (TID) | ORAL | 1 refills | Status: DC
Start: 2023-08-23 — End: 2023-10-19

## 2023-08-23 MED ORDER — OMEPRAZOLE 40 MG PO CPDR: 40.0000 mg | DELAYED_RELEASE_CAPSULE | Freq: Every day | ORAL | 1 refills | Status: AC

## 2023-08-23 MED ORDER — AMLODIPINE BESYLATE 10 MG PO TABS
10.0000 mg | ORAL_TABLET | Freq: Every day | ORAL | 1 refills | Status: DC
Start: 2023-08-23 — End: 2024-09-18

## 2023-08-23 MED ORDER — TRAZODONE HCL 50 MG PO TABS
50.0000 mg | ORAL_TABLET | Freq: Every day | ORAL | 2 refills | Status: DC
Start: 2023-08-23 — End: 2023-09-20

## 2023-08-23 MED ORDER — BREZTRI AEROSPHERE 160-9-4.8 MCG/ACT IN AERO
2.0000 | INHALATION_SPRAY | Freq: Two times a day (BID) | RESPIRATORY_TRACT | 11 refills | Status: AC
Start: 2023-08-23 — End: ?

## 2023-08-23 NOTE — Progress Notes (Signed)
Subjective:    Patient ID: Jeff Patton, male    DOB: [birth year], 57 y.o.   MRN: [id number]  Jeff Patton is a 57 y.o. male presenting on 08/23/2023 for Nausea  Lost to follow-up for 2 years. 1 initial visit in 2022. Now returned to care.  HPI  Discussed the use of AI scribe software for clinical note transcription with the patient, who gave verbal consent to proceed.  History of Present Illness    GERD / Dyspepsia The patient, with a known history of COPD, bronchitis, and hypertension, presents with complaints of insomnia and recurrent nocturnal episodes of heartburn, dry heaving, and vomiting. The patient describes a pattern of waking up hourly after going to bed, followed by heartburn that typically peaks around 3:30 AM. This is often accompanied by dry heaving or vomiting, sometimes progressing to a cough severe enough to induce gagging. The patient also reports a persistent cough, which he attributes to his known respiratory conditions. The patient has been self-medicating with over-the-counter antacids and has recently resumed taking aspirin after a period of discontinuation due to concurrent use of prescribed anti-inflammatories.  The patient also reports early satiety, often feeling full after consuming only half a meal. This is followed by a feeling of discomfort lasting for several hours. The patient has a history of smoking, currently at a pack a day, and consumes approximately two to two and a half alcoholic drinks daily during the week, with increased intake over the weekend.  Previously 1 yr ago had some nosebleeds, but no other blood that comes up. Denies any dark stools or blood in stool  Previously BC regularly, TUMs, he was on Celebrex 200mg  dailyy for inflammatory from Podiatry   Centrilobular Emphysema COPD History of Bronchitis Tobacco Abuse Not on maintenance inhaler, did not follow w/ it last time.  Insomnia He reports frequent sleep disturbance,  frequent awakenings Previously discussed. Unsure if related to mood, anxiety, OSA< he had prior sleep study but it was incomplete.  history of falling asleep while standing, which he attributes to poor sleep quality due to his nocturnal symptoms.    Alcohol Consumption Liquor drink 2 to 2.5 drinks daily during week and then more on weekend.  Plantar Fasciitis / Tendonitis [facility], Dr Ether Griffins He has received NSAID and Injection Long hours on concrete floors.  The patient has a history of a blood clot in the left leg and right arm, for which he was previously on blood thinners. However, these were discontinued following a stroke. The patient reports easy bruising and prolonged bleeding, even with minor injuries.  Fam history of Diabetes  Past history 04/2017 hospitalization for Pneumonia, dx with COPD, and found to have DVT in LLE and PE in R Lung.   06/2018 - hospitalization for Acute CVA Stroke Difficulty with speech at times. No focal deficit     05/18/2021    3:24 PM  Depression screen PHQ 2/9  Decreased Interest 1  Down, Depressed, Hopeless 1  PHQ - 2 Score 2  Altered sleeping 3  Tired, decreased energy 1  Change in appetite 2  Feeling bad or failure about yourself  1  Trouble concentrating 0  Moving slowly or fidgety/restless 1  Suicidal thoughts 0  PHQ-9 Score 10  Difficult doing work/chores Somewhat difficult    Social History   Tobacco Use   Smoking status: Every Day    Current packs/day: 1.00    Types: Cigarettes   Smokeless tobacco: Never  Vaping  Use   Vaping status: Former  Substance Use Topics   Alcohol use: Yes    Comment: 12- 15 drinks per week, varies   Drug use: No    Review of Systems Per HPI unless specifically indicated above     Objective:    BP (!) 140/90 (BP Location: Left Arm, Cuff Size: Normal)   Pulse 88   Temp 98 F (36.7 C) (Oral)   Ht 5\' 11"  (1.803 m)   Wt 197 lb (89.4 kg)   SpO2 98%   BMI 27.48 kg/m   Wt  Readings from Last 3 Encounters:  08/23/23 197 lb (89.4 kg)  05/18/21 199 lb 9.6 oz (90.5 kg)  07/27/20 200 lb (90.7 kg)    Physical Exam Vitals and nursing note reviewed.  Constitutional:      General: He is not in acute distress.    Appearance: He is well-developed. He is not diaphoretic.     Comments: Well-appearing, comfortable, cooperative  HENT:     Head: Normocephalic and atraumatic.  Eyes:     General:        Right eye: No discharge.        Left eye: No discharge.     Conjunctiva/sclera: Conjunctivae normal.  Neck:     Thyroid: No thyromegaly.  Cardiovascular:     Rate and Rhythm: Normal rate and regular rhythm.     Pulses: Normal pulses.     Heart sounds: Normal heart sounds. No murmur heard. Pulmonary:     Effort: Pulmonary effort is normal. No respiratory distress.     Breath sounds: Normal breath sounds. No wheezing or rales.  Musculoskeletal:        General: Normal range of motion.     Cervical back: Normal range of motion and neck supple.  Lymphadenopathy:     Cervical: No cervical adenopathy.  Skin:    General: Skin is warm and dry.     Findings: No erythema or rash.  Neurological:     Mental Status: He is alert and oriented to person, place, and time. Mental status is at baseline.  Psychiatric:        Behavior: Behavior normal.     Comments: Well groomed, good eye contact, normal speech and thoughts      Results for orders placed or performed during the hospital encounter of 07/27/20  SARS Coronavirus 2 by RT PCR (hospital order, performed in [facility] hospital lab) Nasopharyngeal Nasopharyngeal Swab   Specimen: Nasopharyngeal Swab  Result Value Ref Range   SARS Coronavirus 2 NEGATIVE NEGATIVE  Basic metabolic panel  Result Value Ref Range   Sodium 136 135 - 145 mmol/L   Potassium 3.1 (L) 3.5 - 5.1 mmol/L   Chloride 101 98 - 111 mmol/L   CO2 24 22 - 32 mmol/L   Glucose, Bld 110 (H) 70 - 99 mg/dL   BUN 10 6 - 20 mg/dL   Creatinine, Ser 2.72  0.61 - 1.24 mg/dL   Calcium 9.3 8.9 - 53.6 mg/dL   GFR calc non Af Amer >60 >60 mL/min   GFR calc Af Amer >60 >60 mL/min   Anion gap 11 5 - 15  CBC  Result Value Ref Range   WBC 10.0 4.0 - 10.5 K/uL   RBC 4.54 4.22 - 5.81 MIL/uL   Hemoglobin 17.6 (H) 13.0 - 17.0 g/dL   HCT 64.4 03.4 - 74.2 %   MCV 104.2 (H) 80.0 - 100.0 fL   MCH 38.8 (H) 26.0 -  34.0 pg   MCHC 37.2 (H) 30.0 - 36.0 g/dL   RDW 52.8 41.3 - 24.4 %   Platelets 231 150 - 400 K/uL   nRBC 0.0 0.0 - 0.2 %  Urinalysis, Complete w Microscopic Urine, Clean Catch  Result Value Ref Range   Color, Urine YELLOW (A) YELLOW   APPearance HAZY (A) CLEAR   Specific Gravity, Urine 1.019 1.005 - 1.030   pH 5.0 5.0 - 8.0   Glucose, UA NEGATIVE NEGATIVE mg/dL   Hgb urine dipstick MODERATE (A) NEGATIVE   Bilirubin Urine NEGATIVE NEGATIVE   Ketones, ur NEGATIVE NEGATIVE mg/dL   Protein, ur NEGATIVE NEGATIVE mg/dL   Nitrite NEGATIVE NEGATIVE   Leukocytes,Ua NEGATIVE NEGATIVE   RBC / HPF 0-5 0 - 5 RBC/hpf   WBC, UA 0-5 0 - 5 WBC/hpf   Bacteria, UA NONE SEEN NONE SEEN   Squamous Epithelial / HPF NONE SEEN 0 - 5   Mucus PRESENT   Magnesium  Result Value Ref Range   Magnesium 2.0 1.7 - 2.4 mg/dL  Hepatic function panel  Result Value Ref Range   Total Protein 8.1 6.5 - 8.1 g/dL   Albumin 4.1 3.5 - 5.0 g/dL   AST 33 15 - 41 U/L   ALT 37 0 - 44 U/L   Alkaline Phosphatase 66 38 - 126 U/L   Total Bilirubin 0.9 0.3 - 1.2 mg/dL   Bilirubin, Direct 0.2 0.0 - 0.2 mg/dL   Indirect Bilirubin 0.7 0.3 - 0.9 mg/dL  Ammonia  Result Value Ref Range   Ammonia 38 (H) 9 - 35 umol/L  Vitamin B12  Result Value Ref Range   Vitamin B-12 175 (L) 180 - 914 pg/mL  Urine Drug Screen, Qualitative ([facility] only)  Result Value Ref Range   Tricyclic, Ur Screen NONE DETECTED NONE DETECTED   Amphetamines, Ur Screen NONE DETECTED NONE DETECTED   MDMA (Ecstasy)Ur Screen NONE DETECTED NONE DETECTED   Cocaine Metabolite,Ur [place] NONE DETECTED NONE DETECTED    Opiate, Ur Screen NONE DETECTED NONE DETECTED   Phencyclidine (PCP) Ur S NONE DETECTED NONE DETECTED   Cannabinoid 50 Ng, Ur [place] NONE DETECTED NONE DETECTED   Barbiturates, Ur Screen NONE DETECTED NONE DETECTED   Benzodiazepine, Ur Scrn NONE DETECTED NONE DETECTED   Methadone Scn, Ur NONE DETECTED NONE DETECTED  TSH  Result Value Ref Range   TSH 1.400 0.350 - 4.500 uIU/mL  HIV Antibody (routine testing w rflx)  Result Value Ref Range   HIV Screen 4th Generation wRfx Non Reactive Non Reactive  Comprehensive metabolic panel  Result Value Ref Range   Sodium 135 135 - 145 mmol/L   Potassium 3.9 3.5 - 5.1 mmol/L   Chloride 103 98 - 111 mmol/L   CO2 28 22 - 32 mmol/L   Glucose, Bld 96 70 - 99 mg/dL   BUN 10 6 - 20 mg/dL   Creatinine, Ser 0.10 0.61 - 1.24 mg/dL   Calcium 8.6 (L) 8.9 - 10.3 mg/dL   Total Protein 6.3 (L) 6.5 - 8.1 g/dL   Albumin 3.3 (L) 3.5 - 5.0 g/dL   AST 29 15 - 41 U/L   ALT 29 0 - 44 U/L   Alkaline Phosphatase 51 38 - 126 U/L   Total Bilirubin 1.3 (H) 0.3 - 1.2 mg/dL   GFR calc non Af Amer >60 >60 mL/min   GFR calc Af Amer >60 >60 mL/min   Anion gap 4 (L) 5 - 15  CBC  Result Value Ref  Range   WBC 8.1 4.0 - 10.5 K/uL   RBC 4.10 (L) 4.22 - 5.81 MIL/uL   Hemoglobin 15.7 13.0 - 17.0 g/dL   HCT 59.5 63.8 - 75.6 %   MCV 106.1 (H) 80.0 - 100.0 fL   MCH 38.3 (H) 26.0 - 34.0 pg   MCHC 36.1 (H) 30.0 - 36.0 g/dL   RDW 43.3 29.5 - 18.8 %   Platelets 191 150 - 400 K/uL   nRBC 0.0 0.0 - 0.2 %  Fibrin derivatives D-Dimer ([facility] only)  Result Value Ref Range   Fibrin derivatives D-dimer ([facility]) 473.63 0.00 - 499.00 ng/mL (FEU)  Ethanol  Result Value Ref Range   Alcohol, Ethyl (B) <10 <10 mg/dL  CBC  Result Value Ref Range   WBC 7.1 4.0 - 10.5 K/uL   RBC RESULTS UNAVAILABLE DUE TO INTERFERING SUBSTANCE 4.22 - 5.81 MIL/uL   Hemoglobin 16.6 13.0 - 17.0 g/dL   HCT RESULTS UNAVAILABLE DUE TO INTERFERING SUBSTANCE 39.0 - 52.0 %   MCV RESULTS UNAVAILABLE DUE TO INTERFERING  SUBSTANCE 80.0 - 100.0 fL   MCH RESULTS UNAVAILABLE DUE TO INTERFERING SUBSTANCE 26.0 - 34.0 pg   MCHC RESULTS UNAVAILABLE DUE TO INTERFERING SUBSTANCE 30.0 - 36.0 g/dL   RDW RESULTS UNAVAILABLE DUE TO INTERFERING SUBSTANCE 11.5 - 15.5 %   Platelets 206 150 - 400 K/uL   nRBC RESULTS UNAVAILABLE DUE TO INTERFERING SUBSTANCE 0.0 - 0.2 %  Comprehensive metabolic panel  Result Value Ref Range   Sodium 132 (L) 135 - 145 mmol/L   Potassium 3.8 3.5 - 5.1 mmol/L   Chloride 101 98 - 111 mmol/L   CO2 24 22 - 32 mmol/L   Glucose, Bld 93 70 - 99 mg/dL   BUN 15 6 - 20 mg/dL   Creatinine, Ser 4.16 0.61 - 1.24 mg/dL   Calcium 8.8 (L) 8.9 - 10.3 mg/dL   Total Protein 6.5 6.5 - 8.1 g/dL   Albumin 3.4 (L) 3.5 - 5.0 g/dL   AST 31 15 - 41 U/L   ALT 31 0 - 44 U/L   Alkaline Phosphatase 55 38 - 126 U/L   Total Bilirubin 1.1 0.3 - 1.2 mg/dL   GFR calc non Af Amer >60 >60 mL/min   GFR calc Af Amer >60 >60 mL/min   Anion gap 7 5 - 15  Ceruloplasmin  Result Value Ref Range   Ceruloplasmin 19.7 16.0 - 31.0 mg/dL  Copper, serum  Result Value Ref Range   Copper 96 69 - 132 ug/dL  Methylmalonic acid, serum  Result Value Ref Range   Methylmalonic Acid, Quantitative 455 (H) 0 - 378 nmol/L   Disclaimer: Comment   Folate  Result Value Ref Range   Folate 28.0 >5.9 ng/mL  Vitamin B1  Result Value Ref Range   Vitamin B1 (Thiamine) 262.9 (H) 66.5 - 200.0 nmol/L  Vitamin E  Result Value Ref Range   Vitamin E (Alpha Tocopherol) 9.4 7.0 - 25.1 mg/L   Vitamin E(Gamma Tocopherol) 1.6 0.5 - 5.5 mg/L  ESR  Result Value Ref Range   Sed Rate 3 0 - 20 mm/hr  C-reactive protein  Result Value Ref Range   CRP 0.6 <1.0 mg/dL  Glucose, capillary  Result Value Ref Range   Glucose-Capillary 165 (H) 70 - 99 mg/dL  ECHOCARDIOGRAM COMPLETE  Result Value Ref Range   Weight 3,200 oz   Height 70 in   BP 183/110 mmHg   Ao pk vel 0.97 m/s   AV Area  VTI 4.03 cm2   AR max vel 3.88 cm2   AV Mean grad 2.0 mmHg   AV Peak  grad 3.8 mmHg   S' Lateral 2.69 cm   AV Area mean vel 4.06 cm2   Area-P 1/2 4.89 cm2  Troponin I (High Sensitivity)  Result Value Ref Range   Troponin I (High Sensitivity) 4 <18 ng/L      Assessment & Plan:   Problem List Items Addressed This Visit     Alcohol abuse (Chronic)   Relevant Orders   Ambulatory referral to Gastroenterology   US Abdomen Limited RUQ (LIVER/GB)   Protime-INR   Centrilobular emphysema (HCC)   Relevant Medications   BREZTRI AEROSPHERE 160-9-4.8 MCG/ACT AERO   Essential hypertension - Primary   Relevant Medications   amLODipine (NORVASC) 10 MG tablet   Other Relevant Orders   COMPLETE METABOLIC PANEL WITH GFR   CBC with Differential/Platelet   Mixed hyperlipidemia   Relevant Medications   amLODipine (NORVASC) 10 MG tablet   Tobacco abuse   Other Visit Diagnoses     Gastroesophageal reflux disease with esophagitis without hemorrhage       Relevant Orders   Lipase   PUD (peptic ulcer disease)       Relevant Medications   omeprazole (PRILOSEC) 40 MG capsule   sucralfate (CARAFATE) 1 g tablet   Other Relevant Orders   Ambulatory referral to Gastroenterology   COMPLETE METABOLIC PANEL WITH GFR   CBC with Differential/Platelet   Gastroesophageal reflux disease without esophagitis       Relevant Medications   omeprazole (PRILOSEC) 40 MG capsule   sucralfate (CARAFATE) 1 g tablet   Other Relevant Orders   Ambulatory referral to Gastroenterology   Insomnia, unspecified type       Relevant Medications   traZODone (DESYREL) 50 MG tablet   Easy bruising       Relevant Orders   Protime-INR   Abnormal glucose       Relevant Orders   Hemoglobin A1c   Need for hepatitis C screening test       Relevant Orders   Hepatitis C antibody       Assessment and Plan    Gastroesophageal Reflux Disease (GERD) Suspected PUD vs Gastritis - alcoholic? Reports of heartburn, early morning nausea, and vomiting. History of alcohol intake and use of  anti-inflammatories which can exacerbate symptoms. No recent evidence of hematemesis or melena. -Start Omeprazole 40mg  daily to reduce gastric acid production. -Rx Sucralfate as needed for symptomatic relief.  Add RUQ [place] for Liver eval given sequela of cirrhosis raising suspicion with alcohol history  -Referral to Gastroenterology for possible upper endoscopy and other Liver management -Advise reduction in alcohol intake and careful use of anti-inflammatories / Discontinue BC / NSAID if possible..  Insomnia Difficulty maintaining sleep, possibly related to GERD symptoms. Rx Trazodone for sleep aid.  Emphysema / Chronic Obstructive Pulmonary Disease (COPD) Reports of persistent coughing to the point of gagging. History of smoking. Trial sample Breztri and will order, may need med assistance if not covered -Consider further evaluation if symptoms persist.  Plantar Fasciitis and Tendonitis Reports of heel pain and knee pain. Currently using new shoes and insoles for management. -Continue current management strategies. -Consider follow-up with Dr. Ether Griffins if symptoms persist.  Easy Bleeding Reports of easy bruising and prolonged bleeding. History of blood thinners use due to previous blood clots, but discontinued after stroke. -Order blood tests to evaluate coagulation profile and liver function.  Hypertension Blood  pressure reading of 146/90 during visit Restart therapy Amlodipine 10mg  daily   Follow-up in 4-6 weeks to evaluate response to treatment and discuss further management options.        Orders Placed This Encounter  Procedures   US Abdomen Limited RUQ (LIVER/GB)    Standing Status:   Future    Standing Expiration Date:   08/22/2024    Order Specific Question:   Reason for Exam (SYMPTOM  OR DIAGNOSIS REQUIRED)    Answer:   RUQ liver evaluation, chronic alcohol    Order Specific Question:   Preferred imaging location?    Answer:   [facility]   COMPLETE METABOLIC  PANEL WITH GFR   CBC with Differential/Platelet   Hemoglobin A1c   Lipase   Protime-INR   Hepatitis C antibody   Ambulatory referral to Gastroenterology    Referral Priority:   Routine    Referral Type:   Consultation    Referral Reason:   Specialty Services Required    Number of Visits Requested:   1     Meds ordered this encounter  Medications   traZODone (DESYREL) 50 MG tablet    Sig: Take 1 tablet (50 mg total) by mouth at bedtime.    Dispense:  30 tablet    Refill:  2   omeprazole (PRILOSEC) 40 MG capsule    Sig: Take 1 capsule (40 mg total) by mouth daily before breakfast.    Dispense:  90 capsule    Refill:  1   BREZTRI AEROSPHERE 160-9-4.8 MCG/ACT AERO    Sig: Inhale 2 puffs into the lungs 2 (two) times daily.    Dispense:  10.7 g    Refill:  11   sucralfate (CARAFATE) 1 g tablet    Sig: Take 1 tablet (1 g total) by mouth 4 (four) times daily -  with meals and at bedtime.    Dispense:  90 tablet    Refill:  1   amLODipine (NORVASC) 10 MG tablet    Sig: Take 1 tablet (10 mg total) by mouth daily.    Dispense:  90 tablet    Refill:  1      Follow up plan: Return in about 6 weeks (around 10/04/2023) for 6 weeks follow-up GI and other symptoms.   Saralyn Pilar, DO [facility] 08/23/2023, 2:40 PM

## 2023-08-23 NOTE — Patient Instructions (Addendum)
Thank you for coming to the office today.  Referral to GI specialist for acid reflux and liver  [facility] Address: [address] [place], [place], [place] [address] Phone: [phone]  Start stomach acid blocker Omeprazole 40mg  daily every day  Try to avoid and limit BC powder and anti inflammatories to avoid ulcer  Try Breztri inhaler 2 puff twice a day - sample for 7 days Check cost if it is high cost we can try to work on lowering cost or find alternative  Trazodone 50mg  nightly for sleep  Abdominal Ultrasound imaging, they will contact you to schedule.  [facility] Address: [address], [place], [place] [address] Phone: [phone]  Please schedule a Follow-up Appointment to: Return in about 6 weeks (around 10/04/2023) for 6 weeks follow-up GI and other symptoms.  If you have any other questions or concerns, please feel free to call the office or send a message through MyChart. You may also schedule an earlier appointment if necessary.  Additionally, you may be receiving a survey about your experience at our office within a few days to 1 week by e-mail or mail. We value your feedback.  Saralyn Pilar, DO [facility], [place]

## 2023-08-24 ENCOUNTER — Other Ambulatory Visit: Payer: BC Managed Care – PPO

## 2023-08-24 DIAGNOSIS — R233 Spontaneous ecchymoses: Secondary | ICD-10-CM | POA: Diagnosis not present

## 2023-08-24 DIAGNOSIS — K279 Peptic ulcer, site unspecified, unspecified as acute or chronic, without hemorrhage or perforation: Secondary | ICD-10-CM | POA: Diagnosis not present

## 2023-08-24 DIAGNOSIS — I10 Essential (primary) hypertension: Secondary | ICD-10-CM | POA: Diagnosis not present

## 2023-08-24 DIAGNOSIS — R7309 Other abnormal glucose: Secondary | ICD-10-CM | POA: Diagnosis not present

## 2023-08-24 DIAGNOSIS — Z1159 Encounter for screening for other viral diseases: Secondary | ICD-10-CM | POA: Diagnosis not present

## 2023-08-24 LAB — CBC WITH DIFFERENTIAL/PLATELET
Absolute Monocytes: 725 cells/uL (ref 200–950)
Basophils Absolute: 118 cells/uL (ref 0–200)
Basophils Relative: 1.6 %
Eosinophils Absolute: 200 cells/uL (ref 15–500)
Eosinophils Relative: 2.7 %
HCT: 51.4 % — ABNORMAL HIGH (ref 38.5–50.0)
Hemoglobin: 17.6 g/dL — ABNORMAL HIGH (ref 13.2–17.1)
Lymphs Abs: 1687 cells/uL (ref 850–3900)
MCH: 35.8 pg — ABNORMAL HIGH (ref 27.0–33.0)
MCHC: 34.2 g/dL (ref 32.0–36.0)
MCV: 104.7 fL — ABNORMAL HIGH (ref 80.0–100.0)
MPV: 10.8 fL (ref 7.5–12.5)
Monocytes Relative: 9.8 %
Neutro Abs: 4669 cells/uL (ref 1500–7800)
Neutrophils Relative %: 63.1 %
Platelets: 190 10*3/uL (ref 140–400)
RBC: 4.91 10*6/uL (ref 4.20–5.80)
RDW: 11.9 % (ref 11.0–15.0)
Total Lymphocyte: 22.8 %
WBC: 7.4 10*3/uL (ref 3.8–10.8)

## 2023-08-25 LAB — PROTIME-INR
INR: 1.1
Prothrombin Time: 11.8 s — ABNORMAL HIGH (ref 9.0–11.5)

## 2023-08-25 LAB — COMPLETE METABOLIC PANEL WITH GFR
AG Ratio: 1.1 (calc) (ref 1.0–2.5)
ALT: 22 U/L (ref 9–46)
AST: 50 U/L — ABNORMAL HIGH (ref 10–35)
Albumin: 3.8 g/dL (ref 3.6–5.1)
Alkaline phosphatase (APISO): 106 U/L (ref 35–144)
BUN: 11 mg/dL (ref 7–25)
CO2: 32 mmol/L (ref 20–32)
Calcium: 8.9 mg/dL (ref 8.6–10.3)
Chloride: 97 mmol/L — ABNORMAL LOW (ref 98–110)
Creat: 1.02 mg/dL (ref 0.70–1.30)
Globulin: 3.4 g/dL (ref 1.9–3.7)
Glucose, Bld: 87 mg/dL (ref 65–139)
Potassium: 4.1 mmol/L (ref 3.5–5.3)
Sodium: 140 mmol/L (ref 135–146)
Total Bilirubin: 0.7 mg/dL (ref 0.2–1.2)
Total Protein: 7.2 g/dL (ref 6.1–8.1)
eGFR: 87 mL/min/{1.73_m2} (ref >=60)

## 2023-08-25 LAB — HEPATITIS C ANTIBODY: Hepatitis C Ab: NONREACTIVE

## 2023-08-25 LAB — LIPASE: Lipase: 36 U/L (ref 7–60)

## 2023-08-25 LAB — HEMOGLOBIN A1C
Hgb A1c MFr Bld: 5.2 %{Hb} (ref <5.7)
Mean Plasma Glucose: 103 mg/dL
eAG (mmol/L): 5.7 mmol/L

## 2023-09-04 ENCOUNTER — Ambulatory Visit: Admission: RE | Admit: 2023-09-04 | Payer: BC Managed Care – PPO | Source: Ambulatory Visit

## 2023-09-19 ENCOUNTER — Other Ambulatory Visit: Payer: Self-pay | Admitting: Family Medicine

## 2023-09-19 DIAGNOSIS — G4700 Insomnia, unspecified: Secondary | ICD-10-CM

## 2023-09-20 NOTE — Telephone Encounter (Signed)
Requested Prescriptions  Pending Prescriptions Disp Refills   traZODone (DESYREL) 50 MG tablet [Pharmacy Med Name: TRAZODONE 50 MG TABLET] 90 tablet 0    Sig: TAKE 1 TABLET BY MOUTH EVERYDAY AT BEDTIME     Psychiatry: Antidepressants - Serotonin Modulator Passed - 09/19/2023  8:31 AM      Passed - Valid encounter within last 6 months    Recent Outpatient Visits           4 weeks ago Essential hypertension   [place] [facility] [place], Netta Neat, DO   2 years ago Essential hypertension   [place] [facility] Smitty Cords, DO       Future Appointments             In 2 weeks Althea Charon, Netta Neat, DO [place] [facility], [facility]

## 2023-10-04 ENCOUNTER — Ambulatory Visit: Payer: BC Managed Care – PPO | Admitting: Family Medicine

## 2023-10-18 ENCOUNTER — Other Ambulatory Visit: Payer: Self-pay | Admitting: Family Medicine

## 2023-10-18 DIAGNOSIS — K279 Peptic ulcer, site unspecified, unspecified as acute or chronic, without hemorrhage or perforation: Secondary | ICD-10-CM

## 2023-10-19 NOTE — Telephone Encounter (Signed)
Requested Prescriptions  Pending Prescriptions Disp Refills   sucralfate (CARAFATE) 1 g tablet [Pharmacy Med Name: SUCRALFATE 1 GM TABLET] 360 tablet 0    Sig: TAKE 1 TABLET (1 G TOTAL) BY MOUTH 4 TIMES A DAY WITH MEALS AND AT BEDTIME     Gastroenterology: Antiacids Passed - 10/18/2023  1:04 AM      Passed - Valid encounter within last 12 months    Recent Outpatient Visits           1 month ago Essential hypertension   [place] [facility] [place], Netta Neat, DO   2 years ago Essential hypertension   [place] [facility] [place], Netta Neat, [place]

## 2023-12-24 ENCOUNTER — Other Ambulatory Visit: Payer: Self-pay | Admitting: Family Medicine

## 2023-12-24 DIAGNOSIS — G4700 Insomnia, unspecified: Secondary | ICD-10-CM

## 2023-12-26 NOTE — Telephone Encounter (Signed)
Requested medications are due for refill today.  yes  Requested medications are on the active medications list.  yes  Last refill. 09/20/2023 #90 0 rf  Future visit scheduled.   no  Notes to clinic.  Pt was to rtc 6 weeks after last ov, around 10/04/2023. Please review for refill.    Requested Prescriptions  Pending Prescriptions Disp Refills   traZODone (DESYREL) 50 MG tablet [Pharmacy Med Name: TRAZODONE 50 MG TABLET] 90 tablet 0    Sig: TAKE 1 TABLET BY MOUTH EVERYDAY AT BEDTIME     Psychiatry: Antidepressants - Serotonin Modulator Passed - 12/26/2023  1:50 PM      Passed - Valid encounter within last 6 months    Recent Outpatient Visits           4 months ago Essential hypertension   [place] [facility] [place], Netta Neat, DO   2 years ago Essential hypertension   [facility] [place], Netta Neat, [place]

## 2024-01-05 ENCOUNTER — Ambulatory Visit: Payer: BC Managed Care – PPO | Admitting: Family Medicine

## 2024-01-05 ENCOUNTER — Encounter: Payer: Self-pay | Admitting: Family Medicine

## 2024-01-05 VITALS — BP 140/92 | HR 86 | Resp 19 | Ht 71.0 in | Wt 176.8 lb

## 2024-01-05 DIAGNOSIS — K279 Peptic ulcer, site unspecified, unspecified as acute or chronic, without hemorrhage or perforation: Secondary | ICD-10-CM

## 2024-01-05 DIAGNOSIS — J432 Centrilobular emphysema: Secondary | ICD-10-CM | POA: Diagnosis not present

## 2024-01-05 DIAGNOSIS — K2100 Gastro-esophageal reflux disease with esophagitis, without bleeding: Secondary | ICD-10-CM

## 2024-01-05 DIAGNOSIS — R110 Nausea: Secondary | ICD-10-CM

## 2024-01-05 MED ORDER — ONDANSETRON 4 MG PO TBDP
4.0000 mg | ORAL_TABLET | Freq: Three times a day (TID) | ORAL | 0 refills | Status: AC | PRN
Start: 2024-01-05 — End: ?

## 2024-01-05 MED ORDER — ALBUTEROL SULFATE HFA 108 (90 BASE) MCG/ACT IN AERS
2.0000 | INHALATION_SPRAY | Freq: Four times a day (QID) | RESPIRATORY_TRACT | 2 refills | Status: DC | PRN
Start: 2024-01-05 — End: 2024-09-18

## 2024-01-05 MED ORDER — SUCRALFATE 1 G PO TABS
ORAL_TABLET | ORAL | 2 refills | Status: DC
Start: 2024-01-05 — End: 2024-09-11

## 2024-01-05 NOTE — Progress Notes (Signed)
 Subjective:    Patient ID: Jeff Patton, male    DOB: [birth year], 58 y.o.   MRN: [id number]  Jeff Patton is a 58 y.o. male presenting on 01/05/2024 for Weight Loss and Emesis   HPI  Discussed the use of AI scribe software for clinical note transcription with the patient, who gave verbal consent to proceed.  History of Present Illness    Jeff Patton is a 58 year old male who presents with weight loss and vomiting.  He has experienced significant weight loss and vomiting since his last visit in September. His weight decreased from 197 pounds to 176 pounds over the past four to five months, with a more rapid drop occurring within a month. He attributes some of the weight loss to reduced fluid retention in his legs, which has improved with the use of compression socks. Meals are minimal, often unable to finish even small portions due to nausea and early satiety. He mentions an incident where he vomited after consuming chicken noodle soup.  He experiences digestive symptoms including nausea, heartburn, and vomiting. Sucralfate  provides temporary relief of stomach pain and nausea, initially offering some relief. He takes omeprazole  daily, with a prescription of 90 pills plus a refill, and has about eight pills left from the first bottle. He has not been consistent with his blood pressure medication, missing doses occasionally. He also uses trazodone , but not every night, and has a few pills left. He uses a sample Breztri  inhaler, which he carries to work, but has had issues obtaining it from the pharmacy. High cost. Never filled actual inhaler.  He discusses alcohol consumption, stating he is trying to reduce it. Active drinking daily, liquor mostly 2-2.5 daily during week and more on weekend. He uses marijuana occasionally for pain relief and to stimulate appetite, though he acknowledges it can have mixed effects on nausea and hunger.  He describes increased stress and anxiety, partly  due to a recent promotion at work which has increased his responsibilities. He expresses frustration with the workload and the pressure from his colleagues.  He reports a fall about two and a half weeks ago, which occurred while he was at home with his cousin and his cousin's fiance. He tripped over a dog and fell, but does not report any significant injuries from the incident.          01/05/2024   10:39 AM 05/18/2021    3:24 PM  Depression screen PHQ 2/9  Decreased Interest 2 1  Down, Depressed, Hopeless 2 1  PHQ - 2 Score 4 2  Altered sleeping 3 3  Tired, decreased energy 2 1  Change in appetite 3 2  Feeling bad or failure about yourself  1 1  Trouble concentrating 1 0  Moving slowly or fidgety/restless 2 1  Suicidal thoughts 0 0  PHQ-9 Score 16 10  Difficult doing work/chores Somewhat difficult Somewhat difficult       01/05/2024   10:39 AM 05/18/2021    3:24 PM  GAD 7 : Generalized Anxiety Score  Nervous, Anxious, on Edge 2 1  Control/stop worrying 2 1  Worry too much - different things 2 1  Trouble relaxing 1 2  Restless 2 2  Easily annoyed or irritable 1 0  Afraid - awful might happen 1 0  Total GAD 7 Score 11 7  Anxiety Difficulty Somewhat difficult Somewhat difficult    Social History   Tobacco Use   Smoking status: Every Day  Current packs/day: 1.00    Types: Cigarettes   Smokeless tobacco: Never  Vaping Use   Vaping status: Former  Substance Use Topics   Alcohol use: Yes    Comment: 12- 15 drinks per week, varies   Drug use: No    Review of Systems Per HPI unless specifically indicated above     Objective:    BP (!) 140/92 (BP Location: Left Arm, Cuff Size: Normal)   Pulse 86   Resp 19   Ht 5' 11 (1.803 m)   Wt 176 lb 12.8 oz (80.2 kg)   SpO2 100%   BMI 24.66 kg/m   Wt Readings from Last 3 Encounters:  01/05/24 176 lb 12.8 oz (80.2 kg)  08/23/23 197 lb (89.4 kg)  05/18/21 199 lb 9.6 oz (90.5 kg)    Physical Exam Vitals and nursing  note reviewed.  Constitutional:      General: He is not in acute distress.    Appearance: He is well-developed. He is not diaphoretic.     Comments: Well-appearing, mild abdominal discomfort, cooperative  HENT:     Head: Normocephalic and atraumatic.  Eyes:     General:        Right eye: No discharge.        Left eye: No discharge.     Conjunctiva/sclera: Conjunctivae normal.  Neck:     Thyroid: No thyromegaly.  Cardiovascular:     Rate and Rhythm: Normal rate and regular rhythm.     Pulses: Normal pulses.     Heart sounds: Normal heart sounds. No murmur heard. Pulmonary:     Effort: Pulmonary effort is normal. No respiratory distress.     Breath sounds: Normal breath sounds. No wheezing or rales.  Abdominal:     General: There is distension.     Palpations: There is no mass.     Tenderness: There is abdominal tenderness. There is no guarding or rebound.     Hernia: No hernia is present.  Musculoskeletal:        General: Normal range of motion.     Cervical back: Normal range of motion and neck supple.     Right lower leg: Edema present.     Left lower leg: Edema present.  Lymphadenopathy:     Cervical: No cervical adenopathy.  Skin:    General: Skin is warm and dry.     Findings: No erythema or rash.     Comments: Varicose veins R>L   Neurological:     Mental Status: He is alert and oriented to person, place, and time. Mental status is at baseline.  Psychiatric:        Behavior: Behavior normal.     Comments: Well groomed, good eye contact, normal speech and thoughts     Results for orders placed or performed in visit on 08/23/23  COMPLETE METABOLIC PANEL WITH GFR   Collection Time: 08/24/23  1:55 PM  Result Value Ref Range   Glucose, Bld 87 65 - 139 mg/dL   BUN 11 7 - 25 mg/dL   Creat 8.97 9.29 - 8.69 mg/dL   eGFR 87 > OR = 60 fO/fpw/8.26f7   BUN/Creatinine Ratio SEE NOTE: 6 - 22 (calc)   Sodium 140 135 - 146 mmol/L   Potassium 4.1 3.5 - 5.3 mmol/L   Chloride  97 (L) 98 - 110 mmol/L   CO2 32 20 - 32 mmol/L   Calcium  8.9 8.6 - 10.3 mg/dL   Total Protein 7.2 6.1 -  8.1 g/dL   Albumin 3.8 3.6 - 5.1 g/dL   Globulin 3.4 1.9 - 3.7 g/dL (calc)   AG Ratio 1.1 1.0 - 2.5 (calc)   Total Bilirubin 0.7 0.2 - 1.2 mg/dL   Alkaline phosphatase (APISO) 106 35 - 144 U/L   AST 50 (H) 10 - 35 U/L   ALT 22 9 - 46 U/L  CBC with Differential/Platelet   Collection Time: 08/24/23  1:55 PM  Result Value Ref Range   WBC 7.4 3.8 - 10.8 Thousand/uL   RBC 4.91 4.20 - 5.80 Million/uL   Hemoglobin 17.6 (H) 13.2 - 17.1 g/dL   HCT 48.5 (H) 61.4 - 49.9 %   MCV 104.7 (H) 80.0 - 100.0 fL   MCH 35.8 (H) 27.0 - 33.0 pg   MCHC 34.2 32.0 - 36.0 g/dL   RDW 88.0 88.9 - 84.9 %   Platelets 190 140 - 400 Thousand/uL   MPV 10.8 7.5 - 12.5 fL   Neutro Abs 4,669 1,500 - 7,800 cells/uL   Lymphs Abs 1,687 850 - 3,900 cells/uL   Absolute Monocytes 725 200 - 950 cells/uL   Eosinophils Absolute 200 15 - 500 cells/uL   Basophils Absolute 118 0 - 200 cells/uL   Neutrophils Relative % 63.1 %   Total Lymphocyte 22.8 %   Monocytes Relative 9.8 %   Eosinophils Relative 2.7 %   Basophils Relative 1.6 %  Hemoglobin A1c   Collection Time: 08/24/23  1:55 PM  Result Value Ref Range   Hgb A1c MFr Bld 5.2 <5.7 % of total Hgb   Mean Plasma Glucose 103 mg/dL   eAG (mmol/L) 5.7 mmol/L  Lipase   Collection Time: 08/24/23  1:55 PM  Result Value Ref Range   Lipase 36 7 - 60 U/L  Protime-INR   Collection Time: 08/24/23  1:55 PM  Result Value Ref Range   INR 1.1    Prothrombin Time 11.8 (H) 9.0 - 11.5 sec  Hepatitis C antibody   Collection Time: 08/24/23  1:55 PM  Result Value Ref Range   Hepatitis C Ab NON-REACTIVE NON-REACTIVE      Assessment & Plan:   Problem List Items Addressed This Visit     Centrilobular emphysema (HCC)   Relevant Medications   albuterol  (VENTOLIN  HFA) 108 (90 Base) MCG/ACT inhaler   Other Visit Diagnoses       Gastroesophageal reflux disease with esophagitis  without hemorrhage    -  Primary   Relevant Orders   Ambulatory referral to Gastroenterology     PUD (peptic ulcer disease)       Relevant Medications   sucralfate  (CARAFATE ) 1 g tablet   Other Relevant Orders   Ambulatory referral to Gastroenterology     Nausea       Relevant Medications   ondansetron  (ZOFRAN -ODT) 4 MG disintegrating tablet        Gastrointestinal Symptoms Concern for gastritis vs PUD, question alcohol trigger vs NSAID Note ordered RUQ US  back in October 2024, - no show to imaging Persistent nausea, vomiting, and weight loss despite treatment with sucralfate  and omeprazole . Note some inconsistent adherence to meds  -Referral to GI specialist for further evaluation and management. Given severity and constellation of symptoms -Continue omeprazole  40mg  daily, re order -Add Zofran  for nausea control. PRN  Varicose Veins Noted worsening of varicose veins in right leg, but improvement in leg swelling with use of compression socks. -Continue use of compression socks.  Hypertension Blood pressure elevated, with only three pills left  of current medication. -Refill blood pressure medication.  Insomnia Difficulty sleeping, using trazodone  intermittently. -Continue trazodone  as needed for sleep.  Alcohol Dependence Discussion about alcohol use and concern that it is likely contributing to large # of symptoms.  Emphysema COPD Breztri  inhaler sample was helpful, but not covered by insurance. -Order albuterol  rescue inhaler.  General Health Maintenance -Encourage continued efforts to reduce alcohol consumption. -Check in after GI specialist consultation.         Orders Placed This Encounter  Procedures   Ambulatory referral to Gastroenterology    Referral Priority:   Routine    Referral Type:   Consultation    Referral Reason:   Specialty Services Required    Number of Visits Requested:   1    Meds ordered this encounter  Medications   ondansetron   (ZOFRAN -ODT) 4 MG disintegrating tablet    Sig: Take 1 tablet (4 mg total) by mouth every 8 (eight) hours as needed for nausea or vomiting.    Dispense:  30 tablet    Refill:  0   sucralfate  (CARAFATE ) 1 g tablet    Sig: TAKE 1 TABLET (1 G TOTAL) BY MOUTH 4 TIMES A DAY WITH MEALS AND AT BEDTIME    Dispense:  360 tablet    Refill:  2   albuterol  (VENTOLIN  HFA) 108 (90 Base) MCG/ACT inhaler    Sig: Inhale 2 puffs into the lungs every 6 (six) hours as needed for wheezing or shortness of breath.    Dispense:  8 g    Refill:  2    Follow up plan: Return if symptoms worsen or fail to improve.   Marsa Officer, DO [facility] 01/05/2024, 10:48 AM

## 2024-01-05 NOTE — Patient Instructions (Addendum)
 Thank you for coming to the office today.  Referral to GI   [facility] [address] [place], [place] [address] Hours: 8AM-5PM Phone: [phone]  Refill the Omeprazole   Re-sent Breztri  inhaler  Added nausea medication Zofran  dissolving.   Please schedule a Follow-up Appointment to: Return if symptoms worsen or fail to improve.  If you have any other questions or concerns, please feel free to call the office or send a message through MyChart. You may also schedule an earlier appointment if necessary.  Additionally, you may be receiving a survey about your experience at our office within a few days to 1 week by e-mail or mail. We value your feedback.  Marsa Officer, DO [facility], [place]

## 2024-04-07 ENCOUNTER — Other Ambulatory Visit: Payer: Self-pay | Admitting: Family Medicine

## 2024-04-07 DIAGNOSIS — G4700 Insomnia, unspecified: Secondary | ICD-10-CM

## 2024-04-09 NOTE — Telephone Encounter (Signed)
 Requested Prescriptions  Pending Prescriptions Disp Refills   traZODone  (DESYREL ) 50 MG tablet [Pharmacy Med Name: TRAZODONE  50 MG TABLET] 90 tablet 0    Sig: TAKE 1 TABLET BY MOUTH EVERYDAY AT BEDTIME     Psychiatry: Antidepressants - Serotonin Modulator Passed - 04/09/2024 12:05 PM      Passed - Valid encounter within last 6 months    Recent Outpatient Visits           3 months ago Gastroesophageal reflux disease with esophagitis without hemorrhage   [place] [facility] [place], [place], [place]

## 2024-09-11 ENCOUNTER — Encounter: Payer: Self-pay | Admitting: Gastroenterology

## 2024-09-11 ENCOUNTER — Encounter
Admission: RE | Disposition: A | Discharge status: Home / Self Care | Payer: Self-pay | Source: Home / Self Care | Attending: Gastroenterology

## 2024-09-11 ENCOUNTER — Other Ambulatory Visit: Payer: Self-pay

## 2024-09-11 ENCOUNTER — Ambulatory Visit: Admitting: Registered Nurse

## 2024-09-11 ENCOUNTER — Ambulatory Visit
Admission: RE | Admit: 2024-09-11 | Discharge: 2024-09-11 | Disposition: A | Discharge status: Home / Self Care | Attending: Gastroenterology | Admitting: Gastroenterology

## 2024-09-11 DIAGNOSIS — K644 Residual hemorrhoidal skin tags: Secondary | ICD-10-CM | POA: Diagnosis not present

## 2024-09-11 DIAGNOSIS — K635 Polyp of colon: Secondary | ICD-10-CM | POA: Insufficient documentation

## 2024-09-11 DIAGNOSIS — K648 Other hemorrhoids: Secondary | ICD-10-CM | POA: Diagnosis not present

## 2024-09-11 DIAGNOSIS — R1013 Epigastric pain: Secondary | ICD-10-CM | POA: Insufficient documentation

## 2024-09-11 DIAGNOSIS — Z79899 Other long term (current) drug therapy: Secondary | ICD-10-CM | POA: Diagnosis not present

## 2024-09-11 DIAGNOSIS — R634 Abnormal weight loss: Secondary | ICD-10-CM | POA: Diagnosis present

## 2024-09-11 DIAGNOSIS — Z8673 Personal history of transient ischemic attack (TIA), and cerebral infarction without residual deficits: Secondary | ICD-10-CM | POA: Diagnosis not present

## 2024-09-11 DIAGNOSIS — Z59868 Other specified financial insecurity: Secondary | ICD-10-CM | POA: Diagnosis not present

## 2024-09-11 DIAGNOSIS — I10 Essential (primary) hypertension: Secondary | ICD-10-CM | POA: Insufficient documentation

## 2024-09-11 DIAGNOSIS — Z8249 Family history of ischemic heart disease and other diseases of the circulatory system: Secondary | ICD-10-CM | POA: Diagnosis not present

## 2024-09-11 DIAGNOSIS — K2980 Duodenitis without bleeding: Secondary | ICD-10-CM | POA: Insufficient documentation

## 2024-09-11 DIAGNOSIS — F32A Depression, unspecified: Secondary | ICD-10-CM | POA: Insufficient documentation

## 2024-09-11 DIAGNOSIS — J449 Chronic obstructive pulmonary disease, unspecified: Secondary | ICD-10-CM | POA: Diagnosis not present

## 2024-09-11 DIAGNOSIS — F17210 Nicotine dependence, cigarettes, uncomplicated: Secondary | ICD-10-CM | POA: Insufficient documentation

## 2024-09-11 DIAGNOSIS — F419 Anxiety disorder, unspecified: Secondary | ICD-10-CM | POA: Insufficient documentation

## 2024-09-11 DIAGNOSIS — Z5941 Food insecurity: Secondary | ICD-10-CM | POA: Insufficient documentation

## 2024-09-11 DIAGNOSIS — K5730 Diverticulosis of large intestine without perforation or abscess without bleeding: Secondary | ICD-10-CM | POA: Insufficient documentation

## 2024-09-11 HISTORY — PX: POLYPECTOMY: SHX149

## 2024-09-11 HISTORY — DX: Tobacco use: Z72.0

## 2024-09-11 HISTORY — PX: COLONOSCOPY: SHX5424

## 2024-09-11 HISTORY — PX: ESOPHAGOGASTRODUODENOSCOPY: SHX5428

## 2024-09-11 HISTORY — DX: Alcohol abuse, uncomplicated: F10.10

## 2024-09-11 SURGERY — COLONOSCOPY
Anesthesia: General

## 2024-09-11 MED ORDER — DEXMEDETOMIDINE HCL IN NACL 80 MCG/20ML IV SOLN
INTRAVENOUS | Status: DC | PRN
  Administered 2024-09-11: 4 ug via INTRAVENOUS
  Administered 2024-09-11 (×2): 8 ug via INTRAVENOUS

## 2024-09-11 MED ORDER — SODIUM CHLORIDE 0.9 % IV SOLN
INTRAVENOUS | Status: DC
Start: 2024-09-11 — End: 2024-09-11

## 2024-09-11 MED ORDER — DEXMEDETOMIDINE HCL IN NACL 80 MCG/20ML IV SOLN
INTRAVENOUS | Status: AC
  Filled 2024-09-11: qty 20

## 2024-09-11 MED ORDER — GLYCOPYRROLATE 0.2 MG/ML IJ SOLN
INTRAMUSCULAR | Status: DC | PRN
  Administered 2024-09-11: .1 mg via INTRAVENOUS

## 2024-09-11 MED ORDER — LIDOCAINE HCL (PF) 2 % IJ SOLN
INTRAMUSCULAR | Status: AC
  Filled 2024-09-11: qty 5

## 2024-09-11 MED ORDER — PROPOFOL 10 MG/ML IV BOLUS
INTRAVENOUS | Status: DC | PRN
  Administered 2024-09-11: 100 ug/kg/min via INTRAVENOUS
  Administered 2024-09-11: 60 mg via INTRAVENOUS
  Administered 2024-09-11: 100 mg via INTRAVENOUS

## 2024-09-11 MED ORDER — LIDOCAINE HCL (CARDIAC) PF 100 MG/5ML IV SOSY
PREFILLED_SYRINGE | INTRAVENOUS | Status: DC | PRN
  Administered 2024-09-11: 100 mg via INTRAVENOUS

## 2024-09-11 NOTE — H&P (Signed)
 Jeff JONELLE Brooklyn, MD [facility], DHIP [address]  [place], [place] [address]  Main: [phone] Fax:  [phone] Pager: [phone]   Primary Care Physician:  Edman Marsa PARAS, DO Primary Gastroenterologist:  Dr. Corinn JONELLE Patton  Pre-Procedure History & Physical: HPI:  Jeff Patton is a 58 y.o. male is here for an endoscopy and colonoscopy.   Past Medical History:  Diagnosis Date   Allergy    Anxiety    COPD (chronic obstructive pulmonary disease) (HCC)    Depression    ETOH abuse    Hyperlipidemia    Hypertension    Stroke (HCC)    Tobacco abuse     Past Surgical History:  Procedure Laterality Date   HAND TENDON SURGERY Right    SKIN GRAFT     Left arm, back    Prior to Admission medications   Medication Sig Start Date End Date Taking? Authorizing Provider  omeprazole  (PRILOSEC) 40 MG capsule Take 1 capsule (40 mg total) by mouth daily before breakfast. 08/23/23  Yes Karamalegos, Marsa PARAS, DO  albuterol  (VENTOLIN  HFA) 108 (90 Base) MCG/ACT inhaler Inhale 2 puffs into the lungs every 6 (six) hours as needed for wheezing or shortness of breath. 01/05/24   Karamalegos, Marsa PARAS, DO  amLODipine  (NORVASC ) 10 MG tablet Take 1 tablet (10 mg total) by mouth daily. 08/23/23   Karamalegos, Marsa PARAS, DO  atorvastatin  (LIPITOR) 80 MG tablet Take 1 tablet (80 mg total) by mouth daily. 05/18/21   Karamalegos, Marsa PARAS, DO  BREZTRI  AEROSPHERE 160-9-4.8 MCG/ACT AERO Inhale 2 puffs into the lungs 2 (two) times daily. 08/23/23   Karamalegos, Marsa PARAS, DO  ondansetron  (ZOFRAN -ODT) 4 MG disintegrating tablet Take 1 tablet (4 mg total) by mouth every 8 (eight) hours as needed for nausea or vomiting. 01/05/24   Karamalegos, Marsa PARAS, DO  sucralfate  (CARAFATE ) 1 g tablet TAKE 1 TABLET (1 G TOTAL) BY MOUTH 4 TIMES A DAY WITH MEALS AND AT BEDTIME Patient not taking: Reported on 09/11/2024 01/05/24   Edman Marsa PARAS, DO  traZODone   (DESYREL ) 50 MG tablet TAKE 1 TABLET BY MOUTH EVERYDAY AT BEDTIME 04/09/24   Karamalegos, Marsa PARAS, DO  vitamin B-12 (CYANOCOBALAMIN ) 1000 MCG tablet Take 1 tablet (1,000 mcg total) by mouth daily. 05/18/21   Edman Marsa PARAS, DO    Allergies as of 08/30/2024   (No Known Allergies)    Family History  Problem Relation Age of Onset   Hypertension Other     Social History   Socioeconomic History   Marital status: Divorced    Spouse name: Not on file   Number of children: Not on file   Years of education: Not on file   Highest education level: Not on file  Occupational History   Not on file  Tobacco Use   Smoking status: Every Day    Current packs/day: 1.00    Types: Cigarettes   Smokeless tobacco: Never  Vaping Use   Vaping status: Former  Substance and Sexual Activity   Alcohol use: Yes    Comment: 12- 15 drinks per week, varies   Drug use: Yes    Types: Marijuana    Comment: Last August   Sexual activity: Yes  Other Topics Concern   Not on file  Social History Narrative   Not on file   Social Drivers of Health   Financial Resource Strain: High Risk (08/28/2024)   Received from [facility]   Overall  Financial Resource Strain (CARDIA)    Difficulty of Paying Living Expenses: Hard  Food Insecurity: Food Insecurity Present (08/28/2024)   Received from [facility]   Hunger Vital Sign    Within the past 12 months, you worried that your food would run out before you got the money to buy more.: Sometimes true    Within the past 12 months, the food you bought just didn't last and you didn't have money to get more.: Sometimes true  Transportation Needs: No Transportation Needs (08/28/2024)   Received from [facility] - Transportation    In the past 12 months, has lack of transportation kept you from medical appointments or from getting medications?: No    Lack of Transportation (Non-Medical): No   Physical Activity: Not on file  Stress: Not on file  Social Connections: Not on file  Intimate Partner Violence: Not on file    Review of Systems: See HPI, otherwise negative ROS  Physical Exam: BP (!) 187/105   Pulse 65   Temp (!) 96.7 F (35.9 C) (Temporal)   Resp 16   Ht 5' 10 (1.778 m)   Wt 75.8 kg   SpO2 100%   BMI 23.96 kg/m  General:   Alert,  pleasant and cooperative in NAD Head:  Normocephalic and atraumatic. Neck:  Supple; no masses or thyromegaly. Lungs:  Clear throughout to auscultation.    Heart:  Regular rate and rhythm. Abdomen:  Soft, nontender and nondistended. Normal bowel sounds, without guarding, and without rebound.   Neurologic:  Alert and  oriented x4;  grossly normal neurologically.  Impression/Plan: [name] is here for an endoscopy and colonoscopy to be performed for Dyspepsia, unexplained weight loss   Risks, benefits, limitations, and alternatives regarding  endoscopy and colonoscopy have been reviewed with the patient.  Questions have been answered.  All parties agreeable.   Jeff Brooklyn, MD  09/11/2024, 8:26 AM

## 2024-09-11 NOTE — Anesthesia Postprocedure Evaluation (Signed)
 Anesthesia Post Note  Patient: Jeff Patton  Procedure(s) Performed: COLONOSCOPY EGD (ESOPHAGOGASTRODUODENOSCOPY) POLYPECTOMY, INTESTINE  Patient location during evaluation: Endoscopy Anesthesia Type: General Level of consciousness: awake and alert Pain management: pain level controlled Vital Signs Assessment: post-procedure vital signs reviewed and stable Respiratory status: spontaneous breathing, nonlabored ventilation, respiratory function stable and patient connected to nasal cannula oxygen Cardiovascular status: blood pressure returned to baseline and stable Postop Assessment: no apparent nausea or vomiting Anesthetic complications: no   No notable events documented.   Last Vitals:  Vitals:   09/11/24 0905 09/11/24 0915  BP: 118/74 (!) 132/96  Pulse: (!) 57 61  Resp: (!) 9 (!) 23  Temp: (!) 36 C   SpO2: 100% 100%    Last Pain:  Vitals:   09/11/24 0915  TempSrc:   PainSc: 0-No pain                 Lendia LITTIE Mae

## 2024-09-11 NOTE — Op Note (Signed)
 [facility] Gastroenterology Patient Name: Emmaus Brandi Procedure Date: 09/11/2024 8:27 AM MRN: [id number] Account #: [id number] Date of Birth: Apr 15, 1966 Admit Type: Outpatient Age: 58 Room: [facility] ENDO ROOM 2 Gender: Male Note Status: Finalized Instrument Name: Colon Scope [id number] Procedure:             Colonoscopy Indications:           This is the patient's first colonoscopy, Weight loss Providers:             Corinn Jess Brooklyn MD, MD Referring MD:          Oneil RONAL Rolling, MD (Referring MD) Medicines:             General Anesthesia Complications:         No immediate complications. Estimated blood loss: None. Procedure:             Pre-Anesthesia Assessment:                        - Prior to the procedure, a History and Physical was                         performed, and patient medications and allergies were                         reviewed. The patient is competent. The risks and                         benefits of the procedure and the sedation options and                         risks were discussed with the patient. All questions                         were answered and informed consent was obtained.                         Patient identification and proposed procedure were                         verified by the physician, the nurse, the                         anesthesiologist, the anesthetist and the technician                         in the pre-procedure area in the procedure room in the                         endoscopy suite. Mental Status Examination: alert and                         oriented. Airway Examination: normal oropharyngeal                         airway and neck mobility. Respiratory Examination:                         clear to auscultation. CV Examination: normal.  Prophylactic Antibiotics: The patient does not require                         prophylactic antibiotics. Prior Anticoagulants: The                          patient has taken no anticoagulant or antiplatelet                         agents. ASA Grade Assessment: III - A patient with                         severe systemic disease. After reviewing the risks and                         benefits, the patient was deemed in satisfactory                         condition to undergo the procedure. The anesthesia                         plan was to use general anesthesia. Immediately prior                         to administration of medications, the patient was                         re-assessed for adequacy to receive sedatives. The                         heart rate, respiratory rate, oxygen saturations,                         blood pressure, adequacy of pulmonary ventilation, and                         response to care were monitored throughout the                         procedure. The physical status of the patient was                         re-assessed after the procedure.                        After obtaining informed consent, the colonoscope was                         passed under direct vision. Throughout the procedure,                         the patient's blood pressure, pulse, and oxygen                         saturations were monitored continuously. The                         Colonoscope was introduced through the anus and  advanced to the the cecum, identified by appendiceal                         orifice and ileocecal valve. The colonoscopy was                         performed with difficulty due to multiple diverticula                         in the colon. Successful completion of the procedure                         was aided by applying abdominal pressure. The patient                         tolerated the procedure well. The quality of the bowel                         preparation was evaluated using the BBPS [facility] Bowel                         Preparation Scale) with scores of: Right  Colon = 3,                         Transverse Colon = 3 and Left Colon = 3 (entire mucosa                         seen well with no residual staining, small fragments                         of stool or opaque liquid). The total BBPS score                         equals 9. The ileocecal valve, appendiceal orifice,                         and rectum were photographed. Findings:      The perianal and digital rectal examinations were normal. Pertinent       negatives include normal sphincter tone and no palpable rectal lesions.      Multiple diverticula were found in the recto-sigmoid colon and sigmoid       colon.      A 6 mm polyp was found in the sigmoid colon. The polyp was sessile. The       polyp was removed with a cold snare. Resection and retrieval were       complete. Estimated blood loss: none.      Non-bleeding external and internal hemorrhoids were found during       retroflexion. The hemorrhoids were large. Impression:            - Diverticulosis in the recto-sigmoid colon and in the                         sigmoid colon.                        - One 6 mm polyp in the sigmoid colon, removed with a  cold snare. Resected and retrieved.                        - Non-bleeding external and internal hemorrhoids. Recommendation:        - Repeat colonoscopy in 5-10 years for surveillance                         based on pathology results.                        - Discharge patient to home (with escort).                        - Resume previous diet today.                        - Continue present medications.                        - Await pathology results. Procedure Code(s):     --- Professional ---                        [phone], Colonoscopy, flexible; with removal of                         tumor(s), polyp(s), or other lesion(s) by snare                         technique Diagnosis Code(s):     --- Professional ---                        K64.8, Other  hemorrhoids                        R63.4, Abnormal weight loss                        K57.30, Diverticulosis of large intestine without                         perforation or abscess without bleeding                        D12.5, Benign neoplasm of sigmoid colon CPT copyright 2022 American Medical Association. All rights reserved. The codes documented in this report are preliminary and upon coder review may  be revised to meet current compliance requirements. Dr. Corinn Brooklyn Corinn Jess Brooklyn MD, MD 09/11/2024 9:02:34 AM This report has been signed electronically. Number of Addenda: 0 Note Initiated On: 09/11/2024 8:27 AM Scope Withdrawal Time: 0 hours 5 minutes 50 seconds  Total Procedure Duration: 0 hours 11 minutes 19 seconds  Estimated Blood Loss:  Estimated blood loss: none.      [facility]

## 2024-09-11 NOTE — Op Note (Signed)
 [facility] Gastroenterology Patient Name: Jeff Patton Procedure Date: 09/11/2024 8:28 AM MRN: [id number] Account #: [id number] Date of Birth: May 11, 1966 Admit Type: Outpatient Age: 57 Room: [facility] ENDO ROOM 2 Gender: Male Note Status: Finalized Instrument Name: Upper GI Scope [id number] Procedure:             Upper GI endoscopy Indications:           Dyspepsia, Weight loss Providers:             Corinn Jess Brooklyn MD, MD Referring MD:          Oneil RONAL Rolling, MD (Referring MD) Medicines:             General Anesthesia Complications:         No immediate complications. Estimated blood loss: None. Procedure:             Pre-Anesthesia Assessment:                        - Prior to the procedure, a History and Physical was                         performed, and patient medications and allergies were                         reviewed. The patient is competent. The risks and                         benefits of the procedure and the sedation options and                         risks were discussed with the patient. All questions                         were answered and informed consent was obtained.                         Patient identification and proposed procedure were                         verified by the physician, the nurse, the                         anesthesiologist, the anesthetist and the technician                         in the pre-procedure area in the procedure room in the                         endoscopy suite. Mental Status Examination: alert and                         oriented. Airway Examination: normal oropharyngeal                         airway and neck mobility. Respiratory Examination:                         clear to auscultation. CV Examination: normal.  Prophylactic Antibiotics: The patient does not require                         prophylactic antibiotics. Prior Anticoagulants: The                         patient  has taken no anticoagulant or antiplatelet                         agents. ASA Grade Assessment: III - A patient with                         severe systemic disease. After reviewing the risks and                         benefits, the patient was deemed in satisfactory                         condition to undergo the procedure. The anesthesia                         plan was to use general anesthesia. Immediately prior                         to administration of medications, the patient was                         re-assessed for adequacy to receive sedatives. The                         heart rate, respiratory rate, oxygen saturations,                         blood pressure, adequacy of pulmonary ventilation, and                         response to care were monitored throughout the                         procedure. The physical status of the patient was                         re-assessed after the procedure.                        After obtaining informed consent, the endoscope was                         passed under direct vision. Throughout the procedure,                         the patient's blood pressure, pulse, and oxygen                         saturations were monitored continuously. The Endoscope                         was introduced through the mouth, and advanced to the  second part of duodenum. The upper GI endoscopy was                         accomplished without difficulty. The patient tolerated                         the procedure well. Findings:      The duodenal bulb and second portion of the duodenum were normal.       Biopsies were taken with a cold forceps for histology.      The entire examined stomach was normal. Biopsies were taken with a cold       forceps for histology.      The cardia and gastric fundus were normal on retroflexion.      Esophagogastric landmarks were identified: the gastroesophageal junction       was found at 44  cm from the incisors.      The gastroesophageal junction and examined esophagus were normal. Impression:            - Normal duodenal bulb and second portion of the                         duodenum. Biopsied.                        - Normal stomach. Biopsied.                        - Esophagogastric landmarks identified.                        - Normal gastroesophageal junction and esophagus. Recommendation:        - Await pathology results.                        - Proceed with colonoscopy as scheduled                        See colonoscopy report Procedure Code(s):     --- Professional ---                        [phone], Esophagogastroduodenoscopy, flexible,                         transoral; with biopsy, single or multiple Diagnosis Code(s):     --- Professional ---                        R10.13, Epigastric pain                        R63.4, Abnormal weight loss CPT copyright 2022 American Medical Association. All rights reserved. The codes documented in this report are preliminary and upon coder review may  be revised to meet current compliance requirements. Dr. Corinn Brooklyn Corinn Jess Brooklyn MD, MD 09/11/2024 8:46:40 AM This report has been signed electronically. Number of Addenda: 0 Note Initiated On: 09/11/2024 8:28 AM Estimated Blood Loss:  Estimated blood loss: none.      [facility]

## 2024-09-11 NOTE — OR Nursing (Signed)
 Pt mentioned to have loose left lower molar tooth; RN and CRNA witnessed to be intact; team all are aware  EGD complete, noted left lower molar tooth continued intact

## 2024-09-11 NOTE — Transfer of Care (Signed)
 Immediate Anesthesia Transfer of Care Note  Patient: Jeff Patton  Procedure(s) Performed: COLONOSCOPY EGD (ESOPHAGOGASTRODUODENOSCOPY) POLYPECTOMY, INTESTINE  Patient Location: Endoscopy Unit  Anesthesia Type:General  Level of Consciousness: drowsy and patient cooperative  Airway & Oxygen Therapy: Patient Spontanous Breathing  Post-op Assessment: Report given to RN and Post -op Vital signs reviewed and stable  Post vital signs: Reviewed and stable  Last Vitals:  Vitals Value Taken Time  BP 118/74 09/11/24 09:05  Temp 36 C 09/11/24 09:05  Pulse 57 09/11/24 09:05  Resp 9 09/11/24 09:05  SpO2 100 % 09/11/24 09:05  Vitals shown include unfiled device data.  Last Pain:  Vitals:   09/11/24 0905  TempSrc:   PainSc: Asleep         Complications: No notable events documented.

## 2024-09-11 NOTE — Anesthesia Procedure Notes (Signed)
 Procedure Name: MAC Date/Time: 09/11/2024 8:34 AM  Performed by: Lorrene Camelia LABOR, CRNAPre-anesthesia Checklist: Patient identified, Emergency Drugs available, Suction available and Patient being monitored Patient Re-evaluated:Patient Re-evaluated prior to induction Oxygen Delivery Method: Simple face mask Preoxygenation: Pre-oxygenation with 100% oxygen Induction Type: IV induction

## 2024-09-11 NOTE — Anesthesia Preprocedure Evaluation (Addendum)
 Anesthesia Evaluation  Patient identified by MRN, date of birth, ID band Patient awake    Reviewed: Allergy & Precautions, NPO status , Patient's Chart, lab work & pertinent test results  History of Anesthesia Complications Negative for: history of anesthetic complications  Airway Mallampati: III  TM Distance: >3 FB Neck ROM: full    Dental  (+) Chipped, Loose, Poor Dentition   Pulmonary COPD,  COPD inhaler, Current Smoker   Pulmonary exam normal        Cardiovascular hypertension, On Medications Normal cardiovascular exam     Neuro/Psych  PSYCHIATRIC DISORDERS Anxiety Depression    CVA    GI/Hepatic negative GI ROS, Neg liver ROS,,,  Endo/Other  negative endocrine ROS    Renal/GU negative Renal ROS  negative genitourinary   Musculoskeletal   Abdominal   Peds  Hematology negative hematology ROS (+)   Anesthesia Other Findings Past Medical History: No date: Allergy No date: Anxiety No date: COPD (chronic obstructive pulmonary disease) (HCC) No date: Depression No date: ETOH abuse No date: Hyperlipidemia No date: Hypertension No date: Stroke [facility]) No date: Tobacco abuse  Past Surgical History: No date: HAND TENDON SURGERY; Right No date: SKIN GRAFT     Comment:  Left arm, back  BMI    Body Mass Index: 23.96 kg/m      Reproductive/Obstetrics negative OB ROS                              Anesthesia Physical Anesthesia Plan  ASA: 3  Anesthesia Plan: General   Post-op Pain Management: Minimal or no pain anticipated   Induction: Intravenous  PONV Risk Score and Plan: 1 and Propofol infusion and TIVA  Airway Management Planned: Natural Airway and Nasal Cannula  Additional Equipment:   Intra-op Plan:   Post-operative Plan:   Informed Consent: I have reviewed the patients History and Physical, chart, labs and discussed the procedure including the risks, benefits and  alternatives for the proposed anesthesia with the patient or authorized representative who has indicated his/her understanding and acceptance.     Dental Advisory Given  Plan Discussed with: Anesthesiologist, CRNA and Surgeon  Anesthesia Plan Comments: (Patient consented for risks of anesthesia including but not limited to:  - adverse reactions to medications - risk of airway placement if required - damage to eyes, teeth, lips or other oral mucosa - nerve damage due to positioning  - sore throat or hoarseness - Damage to heart, brain, nerves, lungs, other parts of body or loss of life  Patient voiced understanding and assent.)         Anesthesia Quick Evaluation

## 2024-09-12 ENCOUNTER — Ambulatory Visit: Payer: Self-pay | Admitting: Gastroenterology

## 2024-09-12 LAB — SURGICAL PATHOLOGY

## 2024-09-18 ENCOUNTER — Other Ambulatory Visit: Payer: Self-pay | Admitting: Family Medicine

## 2024-09-18 DIAGNOSIS — G4700 Insomnia, unspecified: Secondary | ICD-10-CM

## 2024-09-18 DIAGNOSIS — I10 Essential (primary) hypertension: Secondary | ICD-10-CM

## 2024-09-18 DIAGNOSIS — J432 Centrilobular emphysema: Secondary | ICD-10-CM

## 2024-09-18 NOTE — Telephone Encounter (Unsigned)
 Copied from CRM #[id number]. Topic: Clinical - Medication Refill >> Sep 18, 2024  2:58 PM Hadassah PARAS wrote: Medication: albuterol  (VENTOLIN  HFA) 108 (90 Base) MCG/ACT inhaler amLODipine  (NORVASC ) 10 MG tablet  traZODone  (DESYREL ) 50 MG tablet   Has the patient contacted their pharmacy? No (Agent: If no, request that the patient contact the pharmacy for the refill. If patient does not wish to contact the pharmacy document the reason why and proceed with request.) (Agent: If yes, when and what did the pharmacy advise?)  This is the patient's preferred pharmacy:  [id number] - [place], [place] - [address] [place] [address] Phone: [phone] Fax: [phone]  Is this the correct pharmacy for this prescription? Yes If no, delete pharmacy and type the correct one.   Has the prescription been filled recently? No  Is the patient out of the medication? Yes  Has the patient been seen for an appointment in the last year OR does the patient have an upcoming appointment? Yes  Can we respond through MyChart? No, please advise [phone]  Agent: Please be advised that Rx refills may take up to 3 business days. We ask that you follow-up with your pharmacy.

## 2024-09-18 NOTE — Telephone Encounter (Signed)
 Copied from CRM [id number]. Topic: Clinical - Medication Refill >> Sep 18, 2024  2:52 PM Hadassah PARAS wrote: Medication: albuterol  (VENTOLIN  HFA) 108 (90 Base) MCG/ACT inhaler amLODipine  (NORVASC ) 10 MG tablet  traZODone  (DESYREL ) 50 MG tablet   Has the patient contacted their pharmacy? No (Agent: If no, request that the patient contact the pharmacy for the refill. If patient does not wish to contact the pharmacy document the reason why and proceed with request.) (Agent: If yes, when and what did the pharmacy advise?)  This is the patient's preferred pharmacy:  [id number] - [place], [place] - [address] [place] [address] Phone: [phone] Fax: [phone]  Is this the correct pharmacy for this prescription? Yes If no, delete pharmacy and type the correct one.   Has the prescription been filled recently? No  Is the patient out of the medication? Yes  Has the patient been seen for an appointment in the last year OR does the patient have an upcoming appointment? No  Can we respond through MyChart? No, please advise #[phone]   Agent: Please be advised that Rx refills may take up to 3 business days. We ask that you follow-up with your pharmacy.

## 2024-09-20 MED ORDER — ALBUTEROL SULFATE HFA 108 (90 BASE) MCG/ACT IN AERS: 2.0000 | INHALATION_SPRAY | Freq: Four times a day (QID) | RESPIRATORY_TRACT | 2 refills | Status: AC | PRN

## 2024-09-20 MED ORDER — AMLODIPINE BESYLATE 10 MG PO TABS: 10.0000 mg | ORAL_TABLET | Freq: Every day | ORAL | 0 refills | Status: AC

## 2024-09-20 MED ORDER — TRAZODONE HCL 50 MG PO TABS
50.0000 mg | ORAL_TABLET | Freq: Every day | ORAL | 0 refills | Status: AC
Start: 2024-09-20 — End: ?

## 2024-09-20 NOTE — Telephone Encounter (Signed)
 Requested Prescriptions  Pending Prescriptions Disp Refills   albuterol  (VENTOLIN  HFA) 108 (90 Base) MCG/ACT inhaler 8 g 2    Sig: Inhale 2 puffs into the lungs every 6 (six) hours as needed for wheezing or shortness of breath.     Pulmonology:  Beta Agonists 2 Failed - 09/20/2024 11:38 AM      Failed - Last BP in normal range    BP Readings from Last 1 Encounters:  09/11/24 (!) 132/96         Passed - Last Heart Rate in normal range    Pulse Readings from Last 1 Encounters:  09/11/24 61         Passed - Valid encounter within last 12 months    Recent Outpatient Visits           8 months ago Gastroesophageal reflux disease with esophagitis without hemorrhage   [place] [facility] [place], Marsa PARAS, DO               amLODipine  (NORVASC ) 10 MG tablet 30 tablet 0    Sig: Take 1 tablet (10 mg total) by mouth daily.     Cardiovascular: Calcium  Channel Blockers 2 Failed - 09/20/2024 11:38 AM      Failed - Last BP in normal range    BP Readings from Last 1 Encounters:  09/11/24 (!) 132/96         Failed - Valid encounter within last 6 months    Recent Outpatient Visits           8 months ago Gastroesophageal reflux disease with esophagitis without hemorrhage   [place] [facility], Marsa PARAS, DO              Passed - Last Heart Rate in normal range    Pulse Readings from Last 1 Encounters:  09/11/24 61          traZODone  (DESYREL ) 50 MG tablet 30 tablet 0    Sig: Take 1 tablet (50 mg total) by mouth at bedtime.     Psychiatry: Antidepressants - Serotonin Modulator Failed - 09/20/2024 11:38 AM      Failed - Valid encounter within last 6 months    Recent Outpatient Visits           8 months ago Gastroesophageal reflux disease with esophagitis without hemorrhage   [place] [facility] [place], Marsa PARAS, [place]

## 2024-10-01 ENCOUNTER — Other Ambulatory Visit: Payer: Self-pay | Admitting: Gastroenterology

## 2024-10-01 DIAGNOSIS — R1013 Epigastric pain: Secondary | ICD-10-CM

## 2024-10-01 DIAGNOSIS — R634 Abnormal weight loss: Secondary | ICD-10-CM
